# Patient Record
Sex: Female | Born: 1950 | Race: Black or African American | Hispanic: No | Smoking: Never smoker
Health system: Southern US, Community
[De-identification: ages and names within clinical notes are randomized; demographics above are authoritative.]

## PROBLEM LIST (undated history)

## (undated) DIAGNOSIS — E039 Hypothyroidism, unspecified: Secondary | ICD-10-CM

## (undated) DIAGNOSIS — M1990 Unspecified osteoarthritis, unspecified site: Secondary | ICD-10-CM

## (undated) DIAGNOSIS — E119 Type 2 diabetes mellitus without complications: Secondary | ICD-10-CM

## (undated) DIAGNOSIS — J189 Pneumonia, unspecified organism: Secondary | ICD-10-CM

## (undated) DIAGNOSIS — J449 Chronic obstructive pulmonary disease, unspecified: Secondary | ICD-10-CM

## (undated) DIAGNOSIS — M109 Gout, unspecified: Secondary | ICD-10-CM

## (undated) DIAGNOSIS — I519 Heart disease, unspecified: Secondary | ICD-10-CM

## (undated) DIAGNOSIS — N83209 Unspecified ovarian cyst, unspecified side: Secondary | ICD-10-CM

## (undated) DIAGNOSIS — D329 Benign neoplasm of meninges, unspecified: Secondary | ICD-10-CM

## (undated) DIAGNOSIS — K589 Irritable bowel syndrome without diarrhea: Secondary | ICD-10-CM

## (undated) DIAGNOSIS — I351 Nonrheumatic aortic (valve) insufficiency: Secondary | ICD-10-CM

## (undated) DIAGNOSIS — E785 Hyperlipidemia, unspecified: Secondary | ICD-10-CM

## (undated) DIAGNOSIS — E1142 Type 2 diabetes mellitus with diabetic polyneuropathy: Secondary | ICD-10-CM

## (undated) DIAGNOSIS — R001 Bradycardia, unspecified: Secondary | ICD-10-CM

## (undated) DIAGNOSIS — I10 Essential (primary) hypertension: Secondary | ICD-10-CM

## (undated) DIAGNOSIS — J45909 Unspecified asthma, uncomplicated: Secondary | ICD-10-CM

## (undated) DIAGNOSIS — K5909 Other constipation: Secondary | ICD-10-CM

## (undated) DIAGNOSIS — K649 Unspecified hemorrhoids: Secondary | ICD-10-CM

## (undated) HISTORY — PX: HAND SURGERY: SHX662

## (undated) HISTORY — DX: Essential (primary) hypertension: I10

## (undated) HISTORY — PX: TONSILLECTOMY AND ADENOIDECTOMY: SHX28

## (undated) HISTORY — DX: Chronic obstructive pulmonary disease, unspecified: J44.9

## (undated) HISTORY — PX: HERNIA REPAIR: SHX51

## (undated) HISTORY — PX: TONSILLECTOMY: SUR1361

## (undated) HISTORY — DX: Other constipation: K59.09

## (undated) HISTORY — DX: Unspecified osteoarthritis, unspecified site: M19.90

## (undated) HISTORY — PX: ROTATOR CUFF REPAIR: SHX139

## (undated) HISTORY — DX: Benign neoplasm of meninges, unspecified: D32.9

---
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
## 1981-05-10 HISTORY — PX: CHOLECYSTECTOMY: SHX55

## 1987-05-11 HISTORY — PX: OOPHORECTOMY: SHX86

## 1999-01-10 ENCOUNTER — Inpatient Hospital Stay (HOSPITAL_COMMUNITY): Admission: AD | Admit: 1999-01-10 | Discharge: 1999-01-23 | Payer: Self-pay | Admitting: *Deleted

## 1999-01-13 ENCOUNTER — Encounter: Payer: Self-pay | Admitting: *Deleted

## 1999-01-15 ENCOUNTER — Encounter: Payer: Self-pay | Admitting: Internal Medicine

## 1999-01-21 ENCOUNTER — Encounter: Payer: Self-pay | Admitting: Internal Medicine

## 2007-02-20 ENCOUNTER — Inpatient Hospital Stay (HOSPITAL_COMMUNITY): Admission: RE | Admit: 2007-02-20 | Discharge: 2007-02-27 | Payer: Self-pay | Admitting: Psychiatry

## 2007-02-20 ENCOUNTER — Ambulatory Visit: Payer: Self-pay | Admitting: Psychiatry

## 2010-05-10 HISTORY — PX: KNEE SURGERY: SHX244

## 2010-06-24 HISTORY — PX: ESOPHAGOGASTRODUODENOSCOPY: SHX1529

## 2010-09-25 NOTE — Discharge Summary (Signed)
NAMERUE, VALLADARES NO.:  [id number]   MEDICAL RECORD NO.:  [id number]          PATIENT TYPE:  IPS   LOCATION:  0508                          FACILITY:  [facility]   PHYSICIAN:  Anselm Jungling, MD  DATE OF BIRTH:  03/30/1951   DATE OF ADMISSION:  02/20/2007  DATE OF DISCHARGE:  02/27/2007                               DISCHARGE SUMMARY   IDENTIFYING DATA/REASON FOR ADMISSION:  This was an inpatient  psychiatric admission for Michelle Harding, a 60 year old divorced African-American  female who came to [place] with a long history of depression.  She was  admitted because of increasing suicidal ideation.  She described  multiple disabilities, including physical disabilities and no current  treatment for her mood disorder.  Please refer to the admission note for  further details pertaining to the symptoms, circumstances and history  that led to her hospitalization.   INITIAL DIAGNOSTIC IMPRESSION:  She was given an initial AXIS I  diagnosis of depressive disorder not otherwise specified.   MEDICAL/LABORATORY:  The patient was medically and physically assessed  by the psychiatric nurse practitioner.  She came to [place] with a history of  medical problems including hypothyroidism, seasonal allergies,  gastroesophageal reflux disease, hypertension, and was continued on a  regimen of Synthroid, Claritin, Pepcid, Singulair, aspirin, Norvasc,  Diovan, Lasix, and Mobic.  There were no acute medical issues during her  inpatient stay.   HOSPITAL COURSE:  The patient was admitted to the adult inpatient  psychiatric service.  She presented as a well-nourished, well-developed  woman who was alert and fully oriented.  She was quite well-organized.  She was a very pleasant individual but quite sad.  There were no signs  or symptoms of psychosis or thought disorder.  She denied any active  suicidal ideation and verbalized a strong desire for help.   The patient indicated that in the past she had  had a good response to  Prozac and Prozac was restarted at a low dose of 20 mg daily, and once  well-tolerated, was quickly increased to 60 mg daily.  She reported that  she had previously taken up to 80 mg daily of Prozac with good tolerance  and good response.   She participated in various therapeutic groups and activities and was a  good participant in the treatment program.  She indicated that family  matters were a primary source of her stress.  She was able to focus well  on these issues, and we arranged for there to be a family meeting, which  occurred on the fifth hospital day.  In that meeting, the patient's  daughter, two sons and the family therapist met, and discussed concerns  around discharge planning and stress reduction.  The patient spoke about  being overwhelmed with her efforts to help others in the family.  One  stressor includes an 80 year old son who has been diagnosed with bipolar  disorder, has anger problems, and has been hospitalized many times.  The  patient's family reported that the patient had only been getting 3-5  hours of sleep per night because she  is still busy with all these  helping activities.  The patient spoke about needing to get back to her  usual place of worship, feeling that if she was spiritually strong that  she would be well.  The patient accepted the idea of a referral to an  individual therapist and also discussed the possibility of going to a  bipolar support group for herself and her son.  The  bipolar  information sheet was given to the patient.  The patient reported that  she was no longer feeling suicidal and agreed to contact supports if  those feelings returned.  The family was given information on suicide  prevention and the crisis hotline.   The patient continued her stay for an additional two days, and remained  absent any suicidal ideation.  On the eighth hospital day, the patient  indicated that she felt ready for  discharge.  She agreed to the  following aftercare plan.   AFTERCARE:  The patient was to follow up with [facility]  with a walk-in appointment the morning following discharge.  She was  also instructed to follow up with [facility] in [place]  regarding her medical conditions.   DISCHARGE MEDICATIONS:  1. Mobic 15 mg daily.  2. Synthroid 25 mcg daily.  3. Claritin 10 mg daily.  4. Pepcid 20 mg daily.  5. Singulair 10 mg daily.  6. Aspirin 325 mg daily.  7. Norvasc 5 mg daily.  8. Diovan 320 mg daily.  9. Lasix 40 mg daily.  10.Prozac 60 mg daily.   DISCHARGE DIAGNOSES:  AXIS I:  Major depressive disorder, recurrent.  AXIS II:  Deferred.  AXIS III:  History of hypothyroidism, gastroesophageal reflux disease,  seasonal allergies, hypertension.  AXIS IV:  Stressors:  Severe.  AXIS V:  GAF upon discharge 60.      Anselm Jungling, MD  Electronically Signed     SPB/MEDQ  D:  03/06/2007  T:  03/07/2007  Job:  [id number]

## 2011-02-17 LAB — BASIC METABOLIC PANEL
CO2: 29
GFR calc non Af Amer: 51 — ABNORMAL LOW
Glucose, Bld: 130 — ABNORMAL HIGH
Potassium: 3.1 — ABNORMAL LOW
Sodium: 135

## 2011-02-17 LAB — URINALYSIS, ROUTINE W REFLEX MICROSCOPIC
Bilirubin Urine: NEGATIVE
Glucose, UA: NEGATIVE
Hgb urine dipstick: NEGATIVE
Ketones, ur: NEGATIVE
Nitrite: NEGATIVE
Protein, ur: NEGATIVE
Specific Gravity, Urine: 1.009
Urobilinogen, UA: 0.2
pH: 5

## 2011-02-17 LAB — DRUGS OF ABUSE SCREEN W/O ALC, ROUTINE URINE
Amphetamine Screen, Ur: NEGATIVE
Barbiturate Quant, Ur: NEGATIVE
Benzodiazepines.: NEGATIVE
Cocaine Metabolites: NEGATIVE
Creatinine,U: 60
Marijuana Metabolite: NEGATIVE
Methadone: NEGATIVE
Opiate Screen, Urine: NEGATIVE
Phencyclidine (PCP): NEGATIVE
Propoxyphene: NEGATIVE

## 2011-02-17 LAB — URINE MICROSCOPIC-ADD ON

## 2011-02-18 LAB — CBC
Hemoglobin: 12.3
MCHC: 34
RBC: 4
WBC: 6.3

## 2011-02-18 LAB — COMPREHENSIVE METABOLIC PANEL
ALT: 21
Alkaline Phosphatase: 50
CO2: 28
Calcium: 10.1
Chloride: 104
GFR calc non Af Amer: 44 — ABNORMAL LOW
Glucose, Bld: 137 — ABNORMAL HIGH
Sodium: 141
Total Bilirubin: 0.7

## 2011-02-18 LAB — TSH: TSH: 3.611

## 2012-02-07 ENCOUNTER — Inpatient Hospital Stay (HOSPITAL_COMMUNITY)
Admission: EM | Admit: 2012-02-07 | Discharge: 2012-02-10 | DRG: 195 | Disposition: A | Discharge status: Home / Self Care | Payer: Medicare Other | Attending: Family Medicine | Admitting: Family Medicine

## 2012-02-07 ENCOUNTER — Encounter (HOSPITAL_COMMUNITY): Payer: Self-pay | Admitting: Emergency Medicine

## 2012-02-07 ENCOUNTER — Emergency Department (HOSPITAL_COMMUNITY): Payer: Medicare Other

## 2012-02-07 DIAGNOSIS — E119 Type 2 diabetes mellitus without complications: Secondary | ICD-10-CM

## 2012-02-07 DIAGNOSIS — M7989 Other specified soft tissue disorders: Secondary | ICD-10-CM

## 2012-02-07 DIAGNOSIS — IMO0001 Reserved for inherently not codable concepts without codable children: Secondary | ICD-10-CM | POA: Diagnosis present

## 2012-02-07 DIAGNOSIS — K649 Unspecified hemorrhoids: Secondary | ICD-10-CM | POA: Diagnosis present

## 2012-02-07 DIAGNOSIS — R109 Unspecified abdominal pain: Secondary | ICD-10-CM

## 2012-02-07 DIAGNOSIS — M51379 Other intervertebral disc degeneration, lumbosacral region without mention of lumbar back pain or lower extremity pain: Secondary | ICD-10-CM | POA: Diagnosis present

## 2012-02-07 DIAGNOSIS — Z794 Long term (current) use of insulin: Secondary | ICD-10-CM

## 2012-02-07 DIAGNOSIS — M5137 Other intervertebral disc degeneration, lumbosacral region: Secondary | ICD-10-CM | POA: Diagnosis present

## 2012-02-07 DIAGNOSIS — Z7982 Long term (current) use of aspirin: Secondary | ICD-10-CM

## 2012-02-07 DIAGNOSIS — J189 Pneumonia, unspecified organism: Principal | ICD-10-CM

## 2012-02-07 DIAGNOSIS — I10 Essential (primary) hypertension: Secondary | ICD-10-CM

## 2012-02-07 DIAGNOSIS — I498 Other specified cardiac arrhythmias: Secondary | ICD-10-CM | POA: Diagnosis present

## 2012-02-07 DIAGNOSIS — R001 Bradycardia, unspecified: Secondary | ICD-10-CM

## 2012-02-07 DIAGNOSIS — M109 Gout, unspecified: Secondary | ICD-10-CM | POA: Diagnosis present

## 2012-02-07 HISTORY — DX: Heart disease, unspecified: I51.9

## 2012-02-07 HISTORY — DX: Irritable bowel syndrome, unspecified: K58.9

## 2012-02-07 HISTORY — DX: Pneumonia, unspecified organism: J18.9

## 2012-02-07 HISTORY — DX: Unspecified asthma, uncomplicated: J45.909

## 2012-02-07 HISTORY — DX: Essential (primary) hypertension: I10

## 2012-02-07 HISTORY — DX: Type 2 diabetes mellitus without complications: E11.9

## 2012-02-07 HISTORY — DX: Unspecified hemorrhoids: K64.9

## 2012-02-07 HISTORY — DX: Unspecified ovarian cyst, unspecified side: N83.209

## 2012-02-07 HISTORY — DX: Gout, unspecified: M10.9

## 2012-02-07 LAB — URINALYSIS, ROUTINE W REFLEX MICROSCOPIC
Bilirubin Urine: NEGATIVE
Nitrite: NEGATIVE
Specific Gravity, Urine: 1.027 (ref 1.005–1.030)
Urobilinogen, UA: 1 mg/dL (ref 0.0–1.0)

## 2012-02-07 LAB — COMPREHENSIVE METABOLIC PANEL
ALT: 11 U/L (ref 0–35)
Calcium: 9.8 mg/dL (ref 8.4–10.5)
Chloride: 94 mEq/L — ABNORMAL LOW (ref 96–112)
Creatinine, Ser: 0.88 mg/dL (ref 0.50–1.10)
GFR calc Af Amer: 81 mL/min — ABNORMAL LOW (ref >90)
GFR calc non Af Amer: 69 mL/min — ABNORMAL LOW (ref >90)
Potassium: 3.3 mEq/L — ABNORMAL LOW (ref 3.5–5.1)
Sodium: 136 mEq/L (ref 135–145)

## 2012-02-07 LAB — CBC WITH DIFFERENTIAL/PLATELET
Basophils Absolute: 0 10*3/uL (ref 0.0–0.1)
Basophils Relative: 0 % (ref 0–1)
Eosinophils Absolute: 0 10*3/uL (ref 0.0–0.7)
Hemoglobin: 11.4 g/dL — ABNORMAL LOW (ref 12.0–15.0)
MCH: 30.2 pg (ref 26.0–34.0)
MCHC: 33.9 g/dL (ref 30.0–36.0)
Neutro Abs: 8.7 10*3/uL — ABNORMAL HIGH (ref 1.7–7.7)
Neutrophils Relative %: 75 % (ref 43–77)
Platelets: 312 10*3/uL (ref 150–400)
RDW: 15.1 % (ref 11.5–15.5)

## 2012-02-07 LAB — CBC
HCT: 32.1 % — ABNORMAL LOW (ref 36.0–46.0)
Hemoglobin: 10.6 g/dL — ABNORMAL LOW (ref 12.0–15.0)
MCH: 29.6 pg (ref 26.0–34.0)
MCHC: 33 g/dL (ref 30.0–36.0)

## 2012-02-07 LAB — GLUCOSE, CAPILLARY
Glucose-Capillary: 220 mg/dL — ABNORMAL HIGH (ref 70–99)
Glucose-Capillary: 282 mg/dL — ABNORMAL HIGH (ref 70–99)

## 2012-02-07 LAB — URIC ACID: Uric Acid, Serum: 6.8 mg/dL (ref 2.4–7.0)

## 2012-02-07 LAB — CREATININE, SERUM: GFR calc non Af Amer: 76 mL/min — ABNORMAL LOW (ref >90)

## 2012-02-07 LAB — HEMOGLOBIN A1C: Mean Plasma Glucose: 177 mg/dL — ABNORMAL HIGH (ref <117)

## 2012-02-07 LAB — LACTIC ACID, PLASMA: Lactic Acid, Venous: 3 mmol/L — ABNORMAL HIGH (ref 0.5–2.2)

## 2012-02-07 MED ORDER — CLONIDINE HCL 0.1 MG PO TABS
0.1000 mg | ORAL_TABLET | Freq: Every day | ORAL | Status: DC
  Administered 2012-02-07: 0.1 mg via ORAL
  Filled 2012-02-07: qty 1

## 2012-02-07 MED ORDER — ONDANSETRON HCL 4 MG/2ML IJ SOLN
4.0000 mg | Freq: Once | INTRAMUSCULAR | Status: AC
  Administered 2012-02-07: 4 mg via INTRAVENOUS
  Filled 2012-02-07: qty 2

## 2012-02-07 MED ORDER — HYDROMORPHONE HCL PF 1 MG/ML IJ SOLN: 0.5000 mg | INTRAMUSCULAR | Status: DC | PRN

## 2012-02-07 MED ORDER — DEXTROSE 5 % IV SOLN
500.0000 mg | Freq: Once | INTRAVENOUS | Status: AC
  Administered 2012-02-07: 500 mg via INTRAVENOUS
  Filled 2012-02-07: qty 500

## 2012-02-07 MED ORDER — LEVOTHYROXINE SODIUM 25 MCG PO TABS
25.0000 ug | ORAL_TABLET | Freq: Every day | ORAL | Status: DC
  Administered 2012-02-07 – 2012-02-10 (×4): 25 ug via ORAL
  Filled 2012-02-07 (×5): qty 1

## 2012-02-07 MED ORDER — FAMOTIDINE 20 MG PO TABS
20.0000 mg | ORAL_TABLET | Freq: Every day | ORAL | Status: DC
  Administered 2012-02-07 – 2012-02-10 (×4): 20 mg via ORAL
  Filled 2012-02-07 (×4): qty 1

## 2012-02-07 MED ORDER — ACETAMINOPHEN 325 MG PO TABS
650.0000 mg | ORAL_TABLET | Freq: Once | ORAL | Status: AC
  Administered 2012-02-07: 650 mg via ORAL

## 2012-02-07 MED ORDER — HYDRALAZINE HCL 20 MG/ML IJ SOLN
10.0000 mg | Freq: Four times a day (QID) | INTRAMUSCULAR | Status: DC | PRN
  Filled 2012-02-07: qty 0.5

## 2012-02-07 MED ORDER — IOHEXOL 300 MG/ML  SOLN
100.0000 mL | Freq: Once | INTRAMUSCULAR | Status: AC | PRN
  Administered 2012-02-07: 100 mL via INTRAVENOUS

## 2012-02-07 MED ORDER — AMLODIPINE BESYLATE 5 MG PO TABS
5.0000 mg | ORAL_TABLET | Freq: Every day | ORAL | Status: DC
  Administered 2012-02-07: 5 mg via ORAL
  Filled 2012-02-07: qty 1

## 2012-02-07 MED ORDER — SODIUM CHLORIDE 0.9 % IV SOLN
Freq: Once | INTRAVENOUS | Status: AC
  Administered 2012-02-07: 20 mL/h via INTRAVENOUS

## 2012-02-07 MED ORDER — PRAMOXINE-ZINC OXIDE IN MO 1-12.5 % RE OINT
TOPICAL_OINTMENT | Freq: Three times a day (TID) | RECTAL | Status: DC | PRN
  Filled 2012-02-07: qty 28.3

## 2012-02-07 MED ORDER — SIMETHICONE 80 MG PO CHEW
80.0000 mg | CHEWABLE_TABLET | Freq: Four times a day (QID) | ORAL | Status: DC | PRN
  Filled 2012-02-07: qty 1

## 2012-02-07 MED ORDER — SODIUM CHLORIDE 0.9 % IV BOLUS (SEPSIS)
500.0000 mL | Freq: Once | INTRAVENOUS | Status: AC
  Administered 2012-02-07: 500 mL via INTRAVENOUS

## 2012-02-07 MED ORDER — MORPHINE SULFATE 4 MG/ML IJ SOLN
4.0000 mg | Freq: Once | INTRAMUSCULAR | Status: AC
  Administered 2012-02-07: 4 mg via INTRAVENOUS
  Filled 2012-02-07: qty 1

## 2012-02-07 MED ORDER — HYDROCORTISONE 2.5 % RE CREA
TOPICAL_CREAM | Freq: Three times a day (TID) | RECTAL | Status: DC
  Administered 2012-02-07 – 2012-02-10 (×5): via RECTAL
  Filled 2012-02-07: qty 28.35

## 2012-02-07 MED ORDER — PANTOPRAZOLE SODIUM 40 MG PO TBEC
40.0000 mg | DELAYED_RELEASE_TABLET | Freq: Every day | ORAL | Status: DC
  Administered 2012-02-08: 40 mg via ORAL
  Filled 2012-02-07: qty 1

## 2012-02-07 MED ORDER — COLCHICINE 0.6 MG PO TABS
1.2000 mg | ORAL_TABLET | Freq: Once | ORAL | Status: DC
  Filled 2012-02-07: qty 2

## 2012-02-07 MED ORDER — AZITHROMYCIN 500 MG PO TABS
500.0000 mg | ORAL_TABLET | ORAL | Status: DC
  Administered 2012-02-08 – 2012-02-09 (×2): 500 mg via ORAL
  Filled 2012-02-07 (×3): qty 1

## 2012-02-07 MED ORDER — ACETAMINOPHEN 325 MG PO TABS
ORAL_TABLET | ORAL | Status: AC
  Filled 2012-02-07: qty 2

## 2012-02-07 MED ORDER — IOHEXOL 300 MG/ML  SOLN
20.0000 mL | INTRAMUSCULAR | Status: AC
  Administered 2012-02-07: 20 mL via ORAL

## 2012-02-07 MED ORDER — INSULIN DETEMIR 100 UNIT/ML ~~LOC~~ SOLN
10.0000 [IU] | Freq: Every day | SUBCUTANEOUS | Status: DC
  Administered 2012-02-07 – 2012-02-08 (×2): 10 [IU] via SUBCUTANEOUS
  Filled 2012-02-07: qty 10

## 2012-02-07 MED ORDER — ALBUTEROL SULFATE HFA 108 (90 BASE) MCG/ACT IN AERS: 2.0000 | INHALATION_SPRAY | Freq: Four times a day (QID) | RESPIRATORY_TRACT | Status: DC | PRN

## 2012-02-07 MED ORDER — INSULIN ASPART 100 UNIT/ML ~~LOC~~ SOLN
0.0000 [IU] | Freq: Three times a day (TID) | SUBCUTANEOUS | Status: DC
  Administered 2012-02-07 (×2): 5 [IU] via SUBCUTANEOUS
  Administered 2012-02-07: 3 [IU] via SUBCUTANEOUS
  Administered 2012-02-08: 15 [IU] via SUBCUTANEOUS
  Administered 2012-02-08 (×2): 11 [IU] via SUBCUTANEOUS
  Administered 2012-02-09 (×2): 8 [IU] via SUBCUTANEOUS
  Administered 2012-02-09: 11 [IU] via SUBCUTANEOUS
  Administered 2012-02-10 (×2): 3 [IU] via SUBCUTANEOUS

## 2012-02-07 MED ORDER — TRAMADOL HCL 50 MG PO TABS
25.0000 mg | ORAL_TABLET | Freq: Four times a day (QID) | ORAL | Status: DC | PRN
  Administered 2012-02-07: 25 mg via ORAL
  Filled 2012-02-07: qty 1

## 2012-02-07 MED ORDER — ACETAMINOPHEN 325 MG PO TABS
650.0000 mg | ORAL_TABLET | Freq: Four times a day (QID) | ORAL | Status: DC | PRN
  Filled 2012-02-07: qty 2

## 2012-02-07 MED ORDER — SODIUM CHLORIDE 0.9 % IV BOLUS (SEPSIS)
1000.0000 mL | Freq: Once | INTRAVENOUS | Status: AC
  Administered 2012-02-07: 1000 mL via INTRAVENOUS

## 2012-02-07 MED ORDER — COLCHICINE 0.6 MG PO TABS
0.6000 mg | ORAL_TABLET | Freq: Once | ORAL | Status: AC
  Administered 2012-02-07: 0.6 mg via ORAL
  Filled 2012-02-07: qty 1

## 2012-02-07 MED ORDER — AMLODIPINE BESYLATE 10 MG PO TABS
10.0000 mg | ORAL_TABLET | Freq: Every day | ORAL | Status: DC
  Administered 2012-02-08 – 2012-02-10 (×3): 10 mg via ORAL
  Filled 2012-02-07 (×3): qty 1

## 2012-02-07 MED ORDER — METHYLPREDNISOLONE SODIUM SUCC 125 MG IJ SOLR
60.0000 mg | Freq: Four times a day (QID) | INTRAMUSCULAR | Status: AC
  Administered 2012-02-07 – 2012-02-08 (×6): 60 mg via INTRAVENOUS
  Filled 2012-02-07 (×8): qty 0.96

## 2012-02-07 MED ORDER — GUAIFENESIN ER 600 MG PO TB12
600.0000 mg | ORAL_TABLET | Freq: Two times a day (BID) | ORAL | Status: DC
  Administered 2012-02-07 – 2012-02-10 (×7): 600 mg via ORAL
  Filled 2012-02-07 (×10): qty 1

## 2012-02-07 MED ORDER — DEXTROSE 5 % IV SOLN
1.0000 g | INTRAVENOUS | Status: DC
  Administered 2012-02-08 – 2012-02-09 (×2): 1 g via INTRAVENOUS
  Filled 2012-02-07 (×2): qty 10

## 2012-02-07 MED ORDER — INSULIN ASPART 100 UNIT/ML ~~LOC~~ SOLN
0.0000 [IU] | Freq: Every day | SUBCUTANEOUS | Status: DC
  Administered 2012-02-07: 3 [IU] via SUBCUTANEOUS
  Administered 2012-02-08: 5 [IU] via SUBCUTANEOUS

## 2012-02-07 MED ORDER — POTASSIUM CHLORIDE IN NACL 20-0.9 MEQ/L-% IV SOLN
INTRAVENOUS | Status: DC
  Administered 2012-02-07 (×2): via INTRAVENOUS
  Filled 2012-02-07 (×2): qty 1000

## 2012-02-07 MED ORDER — ENOXAPARIN SODIUM 40 MG/0.4ML ~~LOC~~ SOLN
40.0000 mg | SUBCUTANEOUS | Status: DC
  Administered 2012-02-08: 40 mg via SUBCUTANEOUS
  Filled 2012-02-07 (×3): qty 0.4

## 2012-02-07 MED ORDER — CLONIDINE HCL 0.1 MG PO TABS
0.1000 mg | ORAL_TABLET | Freq: Three times a day (TID) | ORAL | Status: DC
  Administered 2012-02-07 – 2012-02-09 (×7): 0.1 mg via ORAL
  Filled 2012-02-07 (×11): qty 1

## 2012-02-07 MED ORDER — DEXTROSE 5 % IV SOLN
1.0000 g | Freq: Once | INTRAVENOUS | Status: AC
  Administered 2012-02-07: 1 g via INTRAVENOUS
  Filled 2012-02-07: qty 10

## 2012-02-07 NOTE — Progress Notes (Signed)
Inpatient Diabetes Program Recommendations  AACE/ADA: New Consensus Statement on Inpatient Glycemic Control (2013)  Target Ranges:  Prepandial:   less than 140 mg/dL      Peak postprandial:   less than 180 mg/dL (1-2 hours)      Critically ill patients:  140 - 180 mg/dL   Reason for Visit: Hyperglycemia  Results for FATE, GALANTI (MRN [id number]) as of 02/07/2012 11:26  Ref. Range 02/07/2012 07:47  Glucose-Capillary Latest Range: 70-99 mg/dL 119 (H)  Results for SIMONNE, BOULOS (MRN [id number]) as of 02/07/2012 11:26  Ref. Range 02/07/2012 00:13  Glucose Latest Range: 70-99 mg/dL 130 (H)    Inpatient Diabetes Program Recommendations Insulin - Meal Coverage: add Novolog 3 units tidwc for meal coverage insulin if pt eats >50% meal HgbA1C: Check HgbA1C to assess glycemic control prior to hospitalization  Will continue to follow.

## 2012-02-07 NOTE — ED Provider Notes (Signed)
History     CSN: [id number]  Arrival date & time 02/07/12  0004   First MD Initiated Contact with Patient 02/07/12 0133      Chief Complaint  Patient presents with  . Abdominal Pain     Patient is a 61 y.o. female presenting with abdominal pain. The history is provided by the patient.  Abdominal Pain The primary symptoms of the illness include abdominal pain, fever, nausea and diarrhea. The primary symptoms of the illness do not include vomiting. The current episode started 2 days ago. The onset of the illness was gradual. The problem has been gradually worsening.  Additional symptoms associated with the illness include chills.  pt reports abdominal pain for at least 2 days She reports body aches and "joint swelling" She also reports "my hemorrhoids are enflamed" and causing bleeding She denies cough.   She reports h/o cholecystectomy and "hernia surgery" performed at outside hospitals  Past Medical History  Diagnosis Date  . Hemorrhoids   . Asthma   . Hypertension   . Diabetes mellitus   . Ovarian cyst     Past Surgical History  Procedure Date  . Tonsillectomy   . Rotator cuff repair   . Tonsillectomy and adenoidectomy   . Hand surgery     No family history on file.  History  Substance Use Topics  . Smoking status: Current Every Day Smoker  . Smokeless tobacco: Not on file  . Alcohol Use: No    OB History    Grav Para Term Preterm Abortions TAB SAB Ect Mult Living                  Review of Systems  Constitutional: Positive for fever and chills.  Gastrointestinal: Positive for nausea, abdominal pain and diarrhea. Negative for vomiting.  All other systems reviewed and are negative.    Allergies  Review of patient's allergies indicates not on file.  Home Medications   Current Outpatient Rx  Name Route Sig Dispense Refill  . ALBUTEROL SULFATE HFA 108 (90 BASE) MCG/ACT IN AERS Inhalation Inhale 2 puffs into the lungs every 6 (six) hours as needed.  For shortness of breath    . AMLODIPINE BESYLATE 5 MG PO TABS Oral Take 5 mg by mouth daily.    . ASPIRIN 325 MG PO TABS Oral Take 325 mg by mouth every 6 (six) hours as needed. For pain    . CLONIDINE HCL 0.1 MG PO TABS Oral Take 0.1 mg by mouth daily.    . COLCHICINE 0.6 MG PO TABS Oral Take 0.6 mg by mouth daily as needed. For gout    . INSULIN DETEMIR 100 UNIT/ML [place] SOLN Subcutaneous Inject 0.5 Units into the skin daily.    . INSULIN LISPRO PROT & LISPRO (50-50) 100 UNIT/ML  SUSP Subcutaneous Inject 5-7 Units into the skin 3 (three) times daily before meals.    Marland Kitchen LEVOTHYROXINE SODIUM 25 MCG PO TABS Oral Take 25 mcg by mouth daily.    . COLON CLEANSER PO Oral Take 1 tablet by mouth daily as needed. For constipation    . OMEPRAZOLE 40 MG PO CPDR Oral Take 40 mg by mouth daily.    Marland Kitchen RANITIDINE HCL 150 MG PO TABS Oral Take 150 mg by mouth daily.      BP 164/105  Pulse 93  Temp 102.2 F (39 C) (Oral)  Resp 16  SpO2 97% BP 177/70  Pulse 84  Temp 99.8 F (37.7 C) (Oral)  Resp  18  SpO2 94%   Physical Exam CONSTITUTIONAL: Well developed/well nourished HEAD AND FACE: Normocephalic/atraumatic EYES: EOMI/PERRL ENMT: Mucous membranes moist NECK: supple no meningeal signs SPINE:entire spine nontender CV: S1/S2 noted, no murmurs/rubs/gallops noted LUNGS: Lungs are clear to auscultation bilaterally, no apparent distress ABDOMEN: soft, diffuse tenderness, tenderness is moderate, no rebound or guarding GU:no cva tenderness Rectal - stool color normal.  No external hemorrhoids noted.  Tenderness noted but no abscess noted, chaperone present NEURO: Pt is awake/alert, moves all extremitiesx4 EXTREMITIES: pulses normal, full ROM SKIN: warm, color normal PSYCH: no abnormalities of mood noted  ED Course  Procedures   Labs Reviewed  CBC WITH DIFFERENTIAL - Abnormal; Notable for the following:    WBC 11.6 (*)     RBC 3.78 (*)     Hemoglobin 11.4 (*)     HCT 33.6 (*)     Neutro Abs  8.7 (*)     All other components within normal limits  COMPREHENSIVE METABOLIC PANEL - Abnormal; Notable for the following:    Potassium 3.3 (*)     Chloride 94 (*)     Glucose, Bld 251 (*)     GFR calc non Af Amer 69 (*)     GFR calc Af Amer 81 (*)     All other components within normal limits  URINALYSIS, ROUTINE W REFLEX MICROSCOPIC - Abnormal; Notable for the following:    Glucose, UA 100 (*)     Ketones, ur 15 (*)     All other components within normal limits  LIPASE, BLOOD  LACTIC ACID, PLASMA   Dg Chest Port 1 View  02/07/2012  *RADIOLOGY REPORT*  Clinical Data: Chest and abdominal pain.  PORTABLE CHEST - 1 VIEW  Comparison: 12/24/2011  Findings: Mild bibasilar opacities. Mildly prominent cardiomediastinal contours, similar to prior. May be accentuated by portable technique and hypoaeration.  No definite pleural effusion. No pneumothorax.  No acute osseous finding.  IMPRESSION: Mild bibasilar opacities; atelectasis versus infiltrate.   Original Report Authenticated By: Waneta Martins, M.D.    2:20 AM Pt with fever, abdominal pain, will require CT imaging She is currently stable at this time 5:03 AM No acute abdominal process However does have pneumonia, likely source of fever This is community acquired pna Her abdomen is tender, though no guarding, does not appear to have surgical abdomen at this time D/w medicine to admit   MDM  Nursing notes including past medical history and social history reviewed and considered in documentation xrays reviewed and considered Labs/vital reviewed and considered         Joya Gaskins, MD 02/07/12 [phone]

## 2012-02-07 NOTE — Progress Notes (Signed)
[facility] PROGRESS NOTE  Michelle Harding ZOX:[id number] DOB: 12-13-1950 DOA: 02/07/2012 PCP: Sheila Oats, MD  Assessment/Plan: Principal Problem:  *Community acquired pneumonia Active Problems:  Hypertension  Diabetes mellitus  Gout attack   1. Community acquired pneumonia: Patient presented with a week of productive cough, without pyrexia, and since 02/04/12, has had increasing RUQ abdominal pain Imaging studies, including CXR and abdominal/pelvic CT scan, revealed infiltration in the RML/Lung base, suspicious for community-acquired pneumonia. Now on iv Rocephin/Azithromycin, day#2. She remains apyrexial. Continue current antibiotics and supportive treatment.  2. Hypertension: This is sub-optimally controlled on pre-admission antihypertensives. Have increased Norvasc to 10 mg daily, and Clonidine to 0.1 mg t.i.d. Will adjust as indicated 3. Diabetes mellitus: Insulin-requiring type 2. Uncontrolled this AM, with CBG of 220. Managing with diet, SSI and Levemir.  4. Gout attack: Patient appears to have a polyarticular flare up of gout, affecting bilateral wrists and right foot. Will manage with a course of steroids. Patient has history of Gi bleed, so NSAIDS are contra-indicated.  5. Abdominal pain: This is probably referred pain from pneumonia. Abdomina/pelvic CT scan showed hepatic steatosis and biliary ductal dilatation is similar to prior status post cholecystectomy. No acute findings were in evidence. 6. Hemorrhoids: Per patient, symptomatic last few days. Managing with Anusol cream.    Code Status: Full Code.  Family Communication: Discussed fully with patient.  Disposition Plan: To be determined.    Brief narrative: 61 y.o. female with history of Hemorrhoids, asthma, HTN, diabetes mellitus, ovarian cyst, IBS, lumbar and knee DJD, presenting with abdominal pain since 02/04/12, and has had trouble with hemorrhoids, and has had a cough, productive of phlegm, over the past one  week. CXR demonstrated findings, consistent with pneumonia. Patient has also had swelling in her right hand and right foot typical for her gout attacks. She was admitted for further management.    Consultants:  N/A.   Procedures:  CXR.  Abdominal/pelvic CT scan.   Antibiotics:  Rocephin/Azithromycin.   HPI/Subjective: C/O pains in bilateral wrists and right foot.   Objective: Vital signs in last 24 hours: Temp:  [99.6 F (37.6 C)-102.2 F (39 C)] 99.6 F (37.6 C) (09/30 8119) Pulse Rate:  [83-93] 83  (09/30 0638) Resp:  [16-18] 18  (09/30 1478) BP: (164-217)/(70-105) 217/99 mmHg (09/30 0638) SpO2:  [94 %-98 %] 98 % (09/30 2956) Weight:  [116.4 kg (256 lb 9.9 oz)] 116.4 kg (256 lb 9.9 oz) (09/30 2130) Weight change:     Intake/Output from previous day:       Physical Exam: General: In some discomfort from, alert, communicative, fully oriented, not short of breath at rest.  HEENT:  Mild clinical pallor, no jaundice, no conjunctival injection or discharge. Hydration is fair.  NECK:  Supple, JVP not seen, no carotid bruits, no palpable lymphadenopathy, no palpable goiter. CHEST:  Clinically clear to auscultation, no wheezes, no crackles. HEART:  Sounds 1 and 2 heard, normal, regular, no murmurs. ABDOMEN:  Morbidly obese, soft, moderate RUQ discomfort, no palpable organomegaly, no palpable masses, normal bowel sounds. GENITALIA:  Not examined. LOWER EXTREMITIES:  No pitting edema, palpable peripheral pulses. MUSCULOSKELETAL SYSTEM:  Has swelling, increased local temperature and decreased ROM both wrists and tenderness right foot, with diminished right ankle ROM, due to pain.  CENTRAL NERVOUS SYSTEM:  No focal neurologic deficit on gross examination.  Lab Results:  [facility] 02/07/12 0013  WBC 11.6*  HGB 11.4*  HCT 33.6*  PLT 312    Basename 02/07/12 0013  NA 136  K 3.3*  CL 94*  CO2 29  GLUCOSE 251*  BUN 13  CREATININE 0.88  CALCIUM 9.8   No results  found for this or any previous visit (from the past 240 hour(s)).   Studies/Results: Ct Abdomen Pelvis W Contrast  02/07/2012  *RADIOLOGY REPORT*  Clinical Data: Abdominal pain  CT ABDOMEN AND PELVIS WITH CONTRAST  Technique:  Multidetector CT imaging of the abdomen and pelvis was performed following the standard protocol during bolus administration of intravenous contrast.  Contrast: 1 OMNIPAQUE IOHEXOL 300 MG/ML  SOLN, OMNIPAQUE IOHEXOL 300 MG/ML  SOLN  Comparison: 12/24/2011, 11/16/2005.  Findings: Trace right pleural effusion.  Mild bibasilar opacities. Small hiatal hernia.  Heart size upper normal.  Trace pericardial fluid.  Low attenuation of the liver is in keeping with fatty infiltration. The liver contour is lobular, nonspecific however similar to priors dating back to 2007.  There is dilatation of the common bile duct and central intrahepatic ducts.  The CBD measures up to 10 mm in diameter, tapering at the ampulla. Absent gallbladder.  Unremarkable spleen, pancreas, adrenal glands.  Symmetric renal enhancement.  No hydronephrosis or hydroureter.  No bowel obstruction.  No CT evidence for colitis.  Normal appendix.  No free intraperitoneal air or fluid.  No lymphadenopathy.  Normal caliber aorta and branch vessels.  Status post anterior abdominal wall hernia repair.  Thin-walled bladder.  CT appearance of the uterus within normal limits.  Left adnexa is similar to priors with three ovarian cysts measuring up to 2.2 cm.  No pelvic lymphadenopathy.  Multilevel degenerative changes of the imaged spine. No acute or aggressive appearing osseous lesion.  IMPRESSION: Mild right lung base opacity and associated trace pleural effusion may reflect pneumonia.  Hepatic steatosis.  Biliary ductal dilatation is similar to prior status post cholecystectomy.  Recommend LFT correlation and ERCP if warranted.  Nonspecific left ovarian cysts, similar to priors. Consider non emergent pelvic ultrasound follow-up.    Original Report Authenticated By: Waneta Martins, M.D.    Dg Chest Port 1 View  02/07/2012  *RADIOLOGY REPORT*  Clinical Data: Chest and abdominal pain.  PORTABLE CHEST - 1 VIEW  Comparison: 12/24/2011  Findings: Mild bibasilar opacities. Mildly prominent cardiomediastinal contours, similar to prior. May be accentuated by portable technique and hypoaeration.  No definite pleural effusion. No pneumothorax.  No acute osseous finding.  IMPRESSION: Mild bibasilar opacities; atelectasis versus infiltrate.   Original Report Authenticated By: Waneta Martins, M.D.     Medications: Scheduled Meds:   . sodium chloride   Intravenous Once  . acetaminophen  650 mg Oral Once  . amLODipine  5 mg Oral Daily  . azithromycin  500 mg Intravenous Once  . azithromycin  500 mg Oral Q24H  . cefTRIAXone (ROCEPHIN)  IV  1 g Intravenous Once  . cefTRIAXone (ROCEPHIN)  IV  1 g Intravenous Q24H  . cloNIDine  0.1 mg Oral Daily  . colchicine  0.6 mg Oral Once  . colchicine  1.2 mg Oral Once  . enoxaparin  40 mg Subcutaneous Q24H  . famotidine  20 mg Oral Daily  . hydrocortisone   Rectal TID  . insulin aspart  0-15 Units Subcutaneous TID WC  . insulin aspart  0-5 Units Subcutaneous QHS  . iohexol  20 mL Oral Q1 Hr x 2  . levothyroxine  25 mcg Oral Q0600  .  morphine injection  4 mg Intravenous Once  .  morphine injection  4 mg Intravenous Once  .  ondansetron  4 mg Intravenous Once  . pantoprazole  40 mg Oral Q1200  . sodium chloride  1,000 mL Intravenous Once  . sodium chloride  500 mL Intravenous Once   Continuous Infusions:   . 0.9 % NaCl with KCl 20 mEq / L     PRN Meds:.albuterol, iohexol, pramoxine-mineral oil-zinc, simethicone    LOS: 0 days   Maekayla Giorgio,CHRISTOPHER  [facility] Pager [phone]. If 8PM-8AM, please contact night-coverage at [email or web address], password [facility] 02/07/2012, 8:37 AM  LOS: 0 days

## 2012-02-07 NOTE — H&P (Signed)
PCP:  [place] in [place]   Chief Complaint:   Abdominal pain  HPI: Michelle Harding is a 61 y.o. female   has a past medical history of Hemorrhoids; Asthma; Hypertension; Diabetes mellitus; Ovarian cyst; Heart disease; IBS (irritable bowel syndrome); DJD (degenerative joint disease) of lumbar spine; and DJD (degenerative joint disease) of knee.   Presented with  She has had abdominal pain since Friday and have had trouble with hemorrhoids. She was found to have incidental Pneumonia. Patient also have had swelling in her right hand and right foot typical for her gout attacks. She has been coughing up mucus since last week.   Review of Systems:    Pertinent positives include:Fevers, chills, fatigue, abdominal pain, nausea, excess mucus,  Constitutional:  No weight loss, night sweats, weight loss  HEENT:  No headaches, Difficulty swallowing,Tooth/dental problems,Sore throat,  No sneezing, itching, ear ache, nasal congestion, post nasal drip,  Cardio-vascular:  No chest pain, Orthopnea, PND, anasarca, dizziness, palpitations.no Bilateral lower extremity swelling  GI:  No heartburn, indigestion, vomiting, diarrhea, change in bowel habits, loss of appetite, melena, blood in stool, hematemesis Resp:  no shortness of breath at rest. No dyspnea on exertion, No  no productive cough, No non-productive cough, No coughing up of blood.No change in color of mucus.No wheezing. Skin:  no rash or lesions. No jaundice GU:  no dysuria, change in color of urine, no urgency or frequency. No straining to urinate.  No flank pain.  Musculoskeletal:  No joint pain or no joint swelling. No decreased range of motion. No back pain.  Psych:  No change in mood or affect. No depression or anxiety. No memory loss.  Neuro: no localizing neurological complaints, no tingling, no weakness, no double vision, no gait abnormality, no slurred speech, no confusion  Otherwise ROS are negative except for above, 10 systems  were reviewed  Past Medical History: Past Medical History  Diagnosis Date  . Hemorrhoids   . Asthma   . Hypertension   . Diabetes mellitus   . Ovarian cyst   . Heart disease   . IBS (irritable bowel syndrome)   . DJD (degenerative joint disease) of lumbar spine   . DJD (degenerative joint disease) of knee    Past Surgical History  Procedure Date  . Tonsillectomy   . Rotator cuff repair   . Tonsillectomy and adenoidectomy   . Hand surgery   . Hernia repair     With mesh     Medications: Prior to Admission medications   Medication Sig Start Date End Date Taking? Authorizing Provider  albuterol (PROVENTIL HFA;VENTOLIN HFA) 108 (90 BASE) MCG/ACT inhaler Inhale 2 puffs into the lungs every 6 (six) hours as needed. For shortness of breath   Yes Historical Provider, MD  amLODipine (NORVASC) 5 MG tablet Take 5 mg by mouth daily.   Yes Historical Provider, MD  aspirin 325 MG tablet Take 325 mg by mouth every 6 (six) hours as needed. For pain   Yes Historical Provider, MD  cloNIDine (CATAPRES) 0.1 MG tablet Take 0.1 mg by mouth daily.   Yes Historical Provider, MD  colchicine 0.6 MG tablet Take 0.6 mg by mouth daily as needed. For gout   Yes Historical Provider, MD  insulin detemir (LEVEMIR) 100 UNIT/ML injection Inject 0.5 Units into the skin daily.   Yes Historical Provider, MD  insulin lispro protamine-insulin lispro (HUMALOG 50/50) (50-50) 100 UNIT/ML SUSP Inject 5-7 Units into the skin 3 (three) times daily before meals.   Yes  Historical Provider, MD  levothyroxine (SYNTHROID, LEVOTHROID) 25 MCG tablet Take 25 mcg by mouth daily.   Yes Historical Provider, MD  Misc Natural Products (COLON CLEANSER PO) Take 1 tablet by mouth daily as needed. For constipation   Yes Historical Provider, MD  omeprazole (PRILOSEC) 40 MG capsule Take 40 mg by mouth daily.   Yes Historical Provider, MD  ranitidine (ZANTAC) 150 MG tablet Take 150 mg by mouth daily.   Yes Historical Provider, MD     Allergies:   Allergies  Allergen Reactions  . Aspirin     Patient says it hurts her stomach, has to be coaed  . Vicodin (Hydrocodone-Acetaminophen)     Patient says Vicodin makes her itch    Social History:  Ambulatory  independently Lives at  home   reports that she has been smoking.  She does not have any smokeless tobacco history on file. She reports that she uses illicit drugs (Marijuana). She reports that she does not drink alcohol.   Family History: family history includes Diabetes type II in her father and son.    Physical Exam: Patient Vitals for the past 24 hrs:  BP Temp Temp src Pulse Resp SpO2  02/07/12 0310 177/70 mmHg 99.8 F (37.7 C) Oral 84  18  94 %  02/07/12 0011 164/105 mmHg 102.2 F (39 C) Oral 93  16  97 %    1. General:  in No Acute distress 2. Psychological: Alert and  Oriented 3. Head/ENT:   Moist  Mucous Membranes                          Head Non traumatic, neck supple                          Normal  Dentition 4. SKIN: normal Skin turgor,  Skin clean Dry and intact no rash 5. Heart: Regular rate and rhythm no Murmur, Rub or gallop 6. Lungs: occasional crackles   7. Abdomen: Soft, with some slight tenderness, Non distended 8. Lower extremities: no clubbing, cyanosis, right ankle red hot and swollen.  9. Neurologically Grossly intact, moving all 4 extremities equally 10. MSK: Normal range of motion  body mass index is unknown because there is no height or weight on file.   Labs on Admission:   [facility] 02/07/12 0013  NA 136  K 3.3*  CL 94*  CO2 29  GLUCOSE 251*  BUN 13  CREATININE 0.88  CALCIUM 9.8  MG --  PHOS --    Basename 02/07/12 0013  AST 15  ALT 11  ALKPHOS 62  BILITOT 0.6  PROT 7.3  ALBUMIN 3.5    Basename 02/07/12 0143  LIPASE 24  AMYLASE --    Basename 02/07/12 0013  WBC 11.6*  NEUTROABS 8.7*  HGB 11.4*  HCT 33.6*  MCV 88.9  PLT 312   No results found for this basename:  CKTOTAL:3,CKMB:3,CKMBINDEX:3,TROPONINI:3 in the last 72 hours No results found for this basename: TSH,T4TOTAL,FREET3,T3FREE,THYROIDAB in the last 72 hours No results found for this basename: VITAMINB12:2,FOLATE:2,FERRITIN:2,TIBC:2,IRON:2,RETICCTPCT:2 in the last 72 hours No results found for this basename: HGBA1C    CrCl is unknown because there is no height on file for the current visit. ABG No results found for this basename: phart, pco2, po2, hco3, tco2, acidbasedef, o2sat     No results found for this basename: DDIMER     UA no evidence of infection  Cultures: No results found for this basename: sdes, specrequest, cult, reptstatus       Radiological Exams on Admission: Ct Abdomen Pelvis W Contrast  02/07/2012  *RADIOLOGY REPORT*  Clinical Data: Abdominal pain  CT ABDOMEN AND PELVIS WITH CONTRAST  Technique:  Multidetector CT imaging of the abdomen and pelvis was performed following the standard protocol during bolus administration of intravenous contrast.  Contrast: 1 OMNIPAQUE IOHEXOL 300 MG/ML  SOLN, OMNIPAQUE IOHEXOL 300 MG/ML  SOLN  Comparison: 12/24/2011, 11/16/2005.  Findings: Trace right pleural effusion.  Mild bibasilar opacities. Small hiatal hernia.  Heart size upper normal.  Trace pericardial fluid.  Low attenuation of the liver is in keeping with fatty infiltration. The liver contour is lobular, nonspecific however similar to priors dating back to 2007.  There is dilatation of the common bile duct and central intrahepatic ducts.  The CBD measures up to 10 mm in diameter, tapering at the ampulla. Absent gallbladder.  Unremarkable spleen, pancreas, adrenal glands.  Symmetric renal enhancement.  No hydronephrosis or hydroureter.  No bowel obstruction.  No CT evidence for colitis.  Normal appendix.  No free intraperitoneal air or fluid.  No lymphadenopathy.  Normal caliber aorta and branch vessels.  Status post anterior abdominal wall hernia repair.  Thin-walled bladder.  CT  appearance of the uterus within normal limits.  Left adnexa is similar to priors with three ovarian cysts measuring up to 2.2 cm.  No pelvic lymphadenopathy.  Multilevel degenerative changes of the imaged spine. No acute or aggressive appearing osseous lesion.  IMPRESSION: Mild right lung base opacity and associated trace pleural effusion may reflect pneumonia.  Hepatic steatosis.  Biliary ductal dilatation is similar to prior status post cholecystectomy.  Recommend LFT correlation and ERCP if warranted.  Nonspecific left ovarian cysts, similar to priors. Consider non emergent pelvic ultrasound follow-up.   Original Report Authenticated By: Waneta Martins, M.D.    Dg Chest Port 1 View  02/07/2012  *RADIOLOGY REPORT*  Clinical Data: Chest and abdominal pain.  PORTABLE CHEST - 1 VIEW  Comparison: 12/24/2011  Findings: Mild bibasilar opacities. Mildly prominent cardiomediastinal contours, similar to prior. May be accentuated by portable technique and hypoaeration.  No definite pleural effusion. No pneumothorax.  No acute osseous finding.  IMPRESSION: Mild bibasilar opacities; atelectasis versus infiltrate.   Original Report Authenticated By: Waneta Martins, M.D.     Chart has been reviewed  Assessment/Plan  61 yo F presented with abdominal pin and worsening hemorrhoids and was found to have incidental pnumonia  Present on Admission:  .Community acquired pneumonia -  - will admit for treatment of CAP will start on appropriate antibiotic coverage.   Obtain sputum cultures, blood cultures if febrile or if decompensates.  Provide oxygen as needed.   .Hypertension - continue home medicaitons .Diabetes mellitus - ssi , hold levemir .Gout attack - will check uric acid, treat with colchicine, avoid steroid in the setting of active infection. R/O DVT with doppler. Patient has history of Gi bleed would avoid NSAIDS. abdominal pain - likely reffered   Prophylaxis:  Lovenox, Protonix  CODE STATUS:  FULL CODE  Other plan as per orders.  I have spent a total of 55 min on this admission  Ezelle Surprenant 02/07/2012, 5:55 AM

## 2012-02-07 NOTE — ED Notes (Signed)
PT. REPORTS UPPER ABDOMINAL PAIN WITH NAUSEA AND DIARRHEA / BLEEDING HEMORRHOIDS ONSET 3 DAYS AGO.

## 2012-02-07 NOTE — Progress Notes (Signed)
VASCULAR LAB PRELIMINARY  PRELIMINARY  PRELIMINARY  PRELIMINARY  Right lower extremity venous duplex completed.    Preliminary report:  Right:  No evidence of DVT, superficial thrombosis, or Baker's cyst.  Marzelle Rutten, RVT 02/07/2012, 11:15 AM

## 2012-02-08 LAB — CBC
HCT: 30.1 % — ABNORMAL LOW (ref 36.0–46.0)
Hemoglobin: 10.2 g/dL — ABNORMAL LOW (ref 12.0–15.0)
MCV: 88.5 fL (ref 78.0–100.0)
RBC: 3.4 MIL/uL — ABNORMAL LOW (ref 3.87–5.11)
WBC: 16.5 10*3/uL — ABNORMAL HIGH (ref 4.0–10.5)

## 2012-02-08 LAB — COMPREHENSIVE METABOLIC PANEL
BUN: 13 mg/dL (ref 6–23)
CO2: 27 mEq/L (ref 19–32)
Calcium: 9.3 mg/dL (ref 8.4–10.5)
Creatinine, Ser: 0.87 mg/dL (ref 0.50–1.10)
GFR calc Af Amer: 82 mL/min — ABNORMAL LOW (ref >90)
GFR calc non Af Amer: 70 mL/min — ABNORMAL LOW (ref >90)
Glucose, Bld: 383 mg/dL — ABNORMAL HIGH (ref 70–99)
Sodium: 135 mEq/L (ref 135–145)
Total Protein: 7 g/dL (ref 6.0–8.3)

## 2012-02-08 LAB — CBC WITH DIFFERENTIAL/PLATELET
Basophils Relative: 0 % (ref 0–1)
HCT: 30.7 % — ABNORMAL LOW (ref 36.0–46.0)
Hemoglobin: 10.4 g/dL — ABNORMAL LOW (ref 12.0–15.0)
Lymphocytes Relative: 5 % — ABNORMAL LOW (ref 12–46)
Monocytes Absolute: 0.7 10*3/uL (ref 0.1–1.0)
Monocytes Relative: 5 % (ref 3–12)
Neutro Abs: 11.7 10*3/uL — ABNORMAL HIGH (ref 1.7–7.7)
Neutrophils Relative %: 89 % — ABNORMAL HIGH (ref 43–77)
RBC: 3.48 MIL/uL — ABNORMAL LOW (ref 3.87–5.11)
WBC: 13.1 10*3/uL — ABNORMAL HIGH (ref 4.0–10.5)

## 2012-02-08 LAB — GLUCOSE, CAPILLARY: Glucose-Capillary: 404 mg/dL — ABNORMAL HIGH (ref 70–99)

## 2012-02-08 MED ORDER — PREDNISONE 20 MG PO TABS
40.0000 mg | ORAL_TABLET | Freq: Every day | ORAL | Status: DC
  Administered 2012-02-09: 40 mg via ORAL
  Filled 2012-02-08 (×2): qty 2

## 2012-02-08 MED ORDER — PANTOPRAZOLE SODIUM 40 MG PO TBEC
40.0000 mg | DELAYED_RELEASE_TABLET | Freq: Two times a day (BID) | ORAL | Status: DC
  Administered 2012-02-09 – 2012-02-10 (×4): 40 mg via ORAL
  Filled 2012-02-08 (×4): qty 1

## 2012-02-08 MED ORDER — INFLUENZA VIRUS VACC SPLIT PF IM SUSP
0.5000 mL | INTRAMUSCULAR | Status: AC
  Administered 2012-02-09: 0.5 mL via INTRAMUSCULAR
  Filled 2012-02-08: qty 0.5

## 2012-02-08 MED ORDER — INSULIN ASPART 100 UNIT/ML ~~LOC~~ SOLN
4.0000 [IU] | Freq: Three times a day (TID) | SUBCUTANEOUS | Status: DC
  Administered 2012-02-08 – 2012-02-10 (×6): 4 [IU] via SUBCUTANEOUS

## 2012-02-08 MED ORDER — PNEUMOCOCCAL VAC POLYVALENT 25 MCG/0.5ML IJ INJ
0.5000 mL | INJECTION | INTRAMUSCULAR | Status: AC
  Administered 2012-02-09: 0.5 mL via INTRAMUSCULAR
  Filled 2012-02-08: qty 0.5

## 2012-02-08 MED ORDER — INSULIN DETEMIR 100 UNIT/ML ~~LOC~~ SOLN
15.0000 [IU] | Freq: Every day | SUBCUTANEOUS | Status: DC
  Administered 2012-02-09 – 2012-02-10 (×2): 15 [IU] via SUBCUTANEOUS
  Filled 2012-02-08: qty 10

## 2012-02-08 NOTE — Progress Notes (Signed)
MD stated to just give pt scheduled 5 units of insulin for bedtime. RN will continue to monitor pt.

## 2012-02-08 NOTE — Progress Notes (Addendum)
[facility] PROGRESS NOTE  Michelle Harding ZOX:[id number] DOB: February 15, 1951 DOA: 02/07/2012 PCP: Sheila Oats, MD  Assessment/Plan: Principal Problem:  *Community acquired pneumonia Active Problems:  Hypertension  Diabetes mellitus  Gout attack   1. Community acquired pneumonia: Patient presented with a week of productive cough, without pyrexia, and since 02/04/12, has had increasing RUQ abdominal pain. Imaging studies, including CXR and abdominal/pelvic CT scan, revealed infiltration in the RML/Lung base, suspicious for community-acquired pneumonia. Now on iv Rocephin/Azithromycin, day#3. She remains apyrexial and feels considerably better today. Continue current antibiotics and supportive treatment.  2. Hypertension: This was sub-optimally controlled on pre-admission antihypertensives. Have increased Norvasc to 10 mg daily, and Clonidine to 0.1 mg t.i.d, with improved control.  3. Diabetes mellitus: Insulin-requiring type 2. Uncontrolled this AM, with CBG of 220. Managing with diet, SSI and Levemir. CBGs are still elevated today, possibly due to steroid treatment. Will adjust Levemir, further. 4. Gout attack: Patient appears to have a polyarticular flare up of gout, affecting bilateral wrists and right foot. Patient has history of GI bleed, so NSAIDS are contra-indicated. Patient was placed on parenteral steroids on 02/07/12, with dramatic response. Today, wrist swelling and tenderness has improved bilaterally, and ROM is much better. Will transition to oral steroids from 02/09/12. 5. Abdominal pain: This is probably referred pain from pneumonia. Abdominal/pelvic CT scan showed hepatic steatosis and biliary ductal dilatation is similar to prior status post cholecystectomy. No acute findings were in evidence. Today, patient no longer has abdominal pain. 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 She is due to see a  gastroenterologist on 02/24/12. Will change PPI to b.i.d, and have urged her to keep her appointment. 6. Hemorrhoids: Per patient, symptomatic last few days. Managing with Anusol cream.    Code Status: Full Code.  Family Communication: Discussed fully with patient.  Disposition Plan: To be determined.   Note: Patient requested a note for work, for her daughter Tomasa Hose, who had to be absent to be with her on 02/07/12, when she was hospitalized. This was provided accordingly.    Brief narrative: 61 y.o. female with history of Hemorrhoids, asthma, HTN, diabetes mellitus, ovarian cyst, IBS, lumbar and knee DJD, presenting with abdominal pain since 02/04/12, and has had trouble with hemorrhoids, and has had a cough, productive of phlegm, over the past one week. CXR demonstrated findings, consistent with pneumonia. Patient has also had swelling in her right hand and right foot typical for her gout attacks. She was admitted for further management.    Consultants:  N/A.   Procedures:  CXR.  Abdominal/pelvic CT scan.   Antibiotics:  Rocephin/Azithromycin.   HPI/Subjective: Feeling much better.   Objective: Vital signs in last 24 hours: Temp:  [98.4 F (36.9 C)-99.8 F (37.7 C)] 98.4 F (36.9 C) (10/01 0520) Pulse Rate:  [70-84] 70  (10/01 0520) Resp:  [16-20] 16  (10/01 0520) BP: (113-148)/(71-87) 144/87 mmHg (10/01 0520) SpO2:  [94 %-96 %] 95 % (10/01 0520) Weight change:  Last BM Date: 02/07/12  Intake/Output from previous day: 09/30 0701 - 10/01 0700 In: 583 [P.O.:358; I.V.:225] Out: -      Physical Exam: General: Sitting in chair comfortably, eating, alert, communicative, fully oriented, not short of breath at rest.  HEENT:  Mild clinical pallor, no jaundice, no conjunctival injection or discharge. Hydration is fair.  NECK:  Supple, JVP not seen, no carotid bruits, no palpable lymphadenopathy, no palpable goiter. CHEST:  Clinically  clear to auscultation, no  wheezes, no crackles. HEART:  Sounds 1 and 2 heard, normal, regular, no murmurs. ABDOMEN:  Morbidly obese, soft, nontender, no palpable organomegaly, no palpable masses, normal bowel sounds. GENITALIA:  Not examined. LOWER EXTREMITIES:  No pitting edema, palpable peripheral pulses. MUSCULOSKELETAL SYSTEM:  In creased ROM and decreased tenderness and swelling, both wrists and tenderness right foot/right ankle.  CENTRAL NERVOUS SYSTEM:  No focal neurologic deficit on gross examination.  Lab Results:  Basename 02/08/12 0505 02/07/12 0848  WBC 13.1* 12.5*  HGB 10.4* 10.6*  HCT 30.7* 32.1*  PLT 310 300    Basename 02/08/12 0505 02/07/12 0848 02/07/12 0013  NA 135 -- 136  K 3.8 -- 3.3*  CL 95* -- 94*  CO2 27 -- 29  GLUCOSE 383* -- 251*  BUN 13 -- 13  CREATININE 0.87 0.82 --  CALCIUM 9.3 -- 9.8   Recent Results (from the past 240 hour(s))  MRSA PCR SCREENING     Status: Normal   Collection Time   02/07/12  5:03 PM      Component Value Range Status Comment   MRSA by PCR NEGATIVE  NEGATIVE Final      Studies/Results: Ct Abdomen Pelvis W Contrast  02/07/2012  *RADIOLOGY REPORT*  Clinical Data: Abdominal pain  CT ABDOMEN AND PELVIS WITH CONTRAST  Technique:  Multidetector CT imaging of the abdomen and pelvis was performed following the standard protocol during bolus administration of intravenous contrast.  Contrast: 1 OMNIPAQUE IOHEXOL 300 MG/ML  SOLN, OMNIPAQUE IOHEXOL 300 MG/ML  SOLN  Comparison: 12/24/2011, 11/16/2005.  Findings: Trace right pleural effusion.  Mild bibasilar opacities. Small hiatal hernia.  Heart size upper normal.  Trace pericardial fluid.  Low attenuation of the liver is in keeping with fatty infiltration. The liver contour is lobular, nonspecific however similar to priors dating back to 2007.  There is dilatation of the common bile duct and central intrahepatic ducts.  The CBD measures up to 10 mm in diameter, tapering at the ampulla. Absent gallbladder.   Unremarkable spleen, pancreas, adrenal glands.  Symmetric renal enhancement.  No hydronephrosis or hydroureter.  No bowel obstruction.  No CT evidence for colitis.  Normal appendix.  No free intraperitoneal air or fluid.  No lymphadenopathy.  Normal caliber aorta and branch vessels.  Status post anterior abdominal wall hernia repair.  Thin-walled bladder.  CT appearance of the uterus within normal limits.  Left adnexa is similar to priors with three ovarian cysts measuring up to 2.2 cm.  No pelvic lymphadenopathy.  Multilevel degenerative changes of the imaged spine. No acute or aggressive appearing osseous lesion.  IMPRESSION: Mild right lung base opacity and associated trace pleural effusion may reflect pneumonia.  Hepatic steatosis.  Biliary ductal dilatation is similar to prior status post cholecystectomy.  Recommend LFT correlation and ERCP if warranted.  Nonspecific left ovarian cysts, similar to priors. Consider non emergent pelvic ultrasound follow-up.   Original Report Authenticated By: Waneta Martins, M.D.    Dg Chest Port 1 View  02/07/2012  *RADIOLOGY REPORT*  Clinical Data: Chest and abdominal pain.  PORTABLE CHEST - 1 VIEW  Comparison: 12/24/2011  Findings: Mild bibasilar opacities. Mildly prominent cardiomediastinal contours, similar to prior. May be accentuated by portable technique and hypoaeration.  No definite pleural effusion. No pneumothorax.  No acute osseous finding.  IMPRESSION: Mild bibasilar opacities; atelectasis versus infiltrate.   Original Report Authenticated By: Waneta Martins, M.D.     Medications: Scheduled Meds:    .  amLODipine  10 mg Oral Daily  . azithromycin  500 mg Oral Q24H  . cefTRIAXone (ROCEPHIN)  IV  1 g Intravenous Q24H  . cloNIDine  0.1 mg Oral TID  . enoxaparin  40 mg Subcutaneous Q24H  . famotidine  20 mg Oral Daily  . guaiFENesin  600 mg Oral BID  . hydrocortisone   Rectal TID  . insulin aspart  0-15 Units Subcutaneous TID WC  . insulin  aspart  0-5 Units Subcutaneous QHS  . insulin detemir  10 Units Subcutaneous Daily  . levothyroxine  25 mcg Oral Q0600  . methylPREDNISolone (SOLU-MEDROL) injection  60 mg Intravenous Q6H  . pantoprazole  40 mg Oral Q1200   Continuous Infusions:  PRN Meds:.acetaminophen, albuterol, hydrALAZINE, HYDROmorphone (DILAUDID) injection, pramoxine-mineral oil-zinc, simethicone, traMADol    LOS: 1 day   Michelle Harding,CHRISTOPHER  [facility] Pager [phone]. If 8PM-8AM, please contact night-coverage at [email or web address], password [facility] 02/08/2012, 1:29 PM  LOS: 1 day

## 2012-02-08 NOTE — Progress Notes (Signed)
Pt had an elevated CBG of 404. RN paged MD on call. Waiting for further orders.

## 2012-02-09 LAB — CBC
HCT: 32.7 % — ABNORMAL LOW (ref 36.0–46.0)
Hemoglobin: 10.9 g/dL — ABNORMAL LOW (ref 12.0–15.0)
MCHC: 33.3 g/dL (ref 30.0–36.0)
MCV: 88.6 fL (ref 78.0–100.0)
RDW: 14.8 % (ref 11.5–15.5)
WBC: 19.2 10*3/uL — ABNORMAL HIGH (ref 4.0–10.5)

## 2012-02-09 LAB — BASIC METABOLIC PANEL
BUN: 27 mg/dL — ABNORMAL HIGH (ref 6–23)
Chloride: 95 mEq/L — ABNORMAL LOW (ref 96–112)
Creatinine, Ser: 1.13 mg/dL — ABNORMAL HIGH (ref 0.50–1.10)
Glucose, Bld: 322 mg/dL — ABNORMAL HIGH (ref 70–99)
Potassium: 3.7 mEq/L (ref 3.5–5.1)

## 2012-02-09 LAB — GLUCOSE, CAPILLARY
Glucose-Capillary: 302 mg/dL — ABNORMAL HIGH (ref 70–99)
Glucose-Capillary: 304 mg/dL — ABNORMAL HIGH (ref 70–99)
Glucose-Capillary: 268 mg/dL — ABNORMAL HIGH (ref 70–99)

## 2012-02-09 MED ORDER — COLCHICINE 0.6 MG PO TABS
0.6000 mg | ORAL_TABLET | Freq: Every day | ORAL | Status: DC
  Filled 2012-02-09 (×2): qty 1

## 2012-02-09 MED ORDER — LEVOFLOXACIN 750 MG PO TABS
750.0000 mg | ORAL_TABLET | Freq: Every day | ORAL | Status: DC
  Administered 2012-02-09: 750 mg via ORAL
  Filled 2012-02-09 (×2): qty 1

## 2012-02-09 MED ORDER — ENOXAPARIN SODIUM 60 MG/0.6ML ~~LOC~~ SOLN
60.0000 mg | SUBCUTANEOUS | Status: DC
  Administered 2012-02-09 – 2012-02-10 (×2): 60 mg via SUBCUTANEOUS
  Filled 2012-02-09 (×3): qty 0.6

## 2012-02-09 NOTE — Progress Notes (Signed)
Inpatient Diabetes Program Recommendations  AACE/ADA: New Consensus Statement on Inpatient Glycemic Control (2013)  Target Ranges:  Prepandial:   less than 140 mg/dL      Peak postprandial:   less than 180 mg/dL (1-2 hours)      Critically ill patients:  140 - 180 mg/dL   Reason for Visit: Hyperglycemia  Pt eating 100% meals.  Results for CAMRI, MOLLOY (MRN [id number]) as of 02/09/2012 10:41  Ref. Range 02/08/2012 11:37 02/08/2012 17:15 02/08/2012 21:52 02/09/2012 05:22 02/09/2012 07:52  Glucose-Capillary Latest Range: 70-99 mg/dL 409 (H) 811 (H) 914 (H) 302 (H) 304 (H)  Results for JERITA, WIMBUSH (MRN [id number]) as of 02/09/2012 10:41  Ref. Range 02/07/2012 08:48  Hemoglobin A1C Latest Range: <5.7 % 7.8 (H)   Hyperglycemia X 3 days.  Recommendations  Increase Levemir to 20 units QAM. Increase meal coverage insulin to Novolog 8 units tidwc if pt eats >50% meal.  Will continue to follow for adjustments.  Thank you. Ailene Ards, RD, LDN, CDE [phone] - Pager

## 2012-02-09 NOTE — Progress Notes (Signed)
Pt HR sustaining at 40. Pt was asleep. Pt is asymptomatic.  RN paged MD on call. MD stated to continue to monitor pt. -Judy Pimple, RN

## 2012-02-09 NOTE — Progress Notes (Signed)
ANTIBIOTIC CONSULT NOTE - INITIAL  Pharmacy Consult for Levaquin Indication: CAP  Allergies  Allergen Reactions  . Aspirin     Patient says it hurts her stomach, has to be coaed  . Vicodin (Hydrocodone-Acetaminophen)     Patient says Vicodin makes her itch    Patient Measurements: Height: 5' 8.4" (173.7 cm) Weight: 256 lb 9.9 oz (116.4 kg) IBW/kg (Calculated) : 64.82   Vital Signs: Temp: 98.7 F (37.1 C) (10/02 0525) Temp src: Oral (10/02 0525) BP: 132/75 mmHg (10/02 0925) Pulse Rate: 62  (10/02 0525) Intake/Output from previous day: 10/01 0701 - 10/02 0700 In: 1080 [P.O.:1080] Out: -  Intake/Output from this shift:    Labs:  Basename 02/09/12 0645 02/08/12 1847 02/08/12 0505 02/07/12 0848  WBC 19.2* 16.5* 13.1* --  HGB 10.9* 10.2* 10.4* --  PLT 402* 351 310 --  LABCREA -- -- -- --  CREATININE 1.13* -- 0.87 0.82   Estimated Creatinine Clearance: 70.5 ml/min (by C-G formula based on Cr of 1.13). No results found for this basename: VANCOTROUGH:2,VANCOPEAK:2,VANCORANDOM:2,GENTTROUGH:2,GENTPEAK:2,GENTRANDOM:2,TOBRATROUGH:2,TOBRAPEAK:2,TOBRARND:2,AMIKACINPEAK:2,AMIKACINTROU:2,AMIKACIN:2, in the last 72 hours   Microbiology: Recent Results (from the past 720 hour(s))  MRSA PCR SCREENING     Status: Normal   Collection Time   02/07/12  5:03 PM      Component Value Range Status Comment   MRSA by PCR NEGATIVE  NEGATIVE Final     Medical History: Past Medical History  Diagnosis Date  . Hemorrhoids   . Asthma   . Hypertension   . Diabetes mellitus   . Ovarian cyst   . Heart disease   . IBS (irritable bowel syndrome)   . DJD (degenerative joint disease) of lumbar spine   . DJD (degenerative joint disease) of knee     Medications:  Prescriptions prior to admission  Medication Sig Dispense Refill  . albuterol (PROVENTIL HFA;VENTOLIN HFA) 108 (90 BASE) MCG/ACT inhaler Inhale 2 puffs into the lungs every 6 (six) hours as needed. For shortness of breath      .  amLODipine (NORVASC) 5 MG tablet Take 5 mg by mouth daily.      Marland Kitchen aspirin 325 MG tablet Take 325 mg by mouth every 6 (six) hours as needed. For pain      . cloNIDine (CATAPRES) 0.1 MG tablet Take 0.1 mg by mouth daily.      . colchicine 0.6 MG tablet Take 0.6 mg by mouth daily as needed. For gout      . insulin detemir (LEVEMIR) 100 UNIT/ML injection Inject 0.5 Units into the skin daily.      . insulin lispro protamine-insulin lispro (HUMALOG 50/50) (50-50) 100 UNIT/ML SUSP Inject 5-7 Units into the skin 3 (three) times daily before meals.      Marland Kitchen levothyroxine (SYNTHROID, LEVOTHROID) 25 MCG tablet Take 25 mcg by mouth daily.      . Misc Natural Products (COLON CLEANSER PO) Take 1 tablet by mouth daily as needed. For constipation      . omeprazole (PRILOSEC) 40 MG capsule Take 40 mg by mouth daily.      . ranitidine (ZANTAC) 150 MG tablet Take 150 mg by mouth daily.       Assessment: 61 y/o female patient admitted with pneumonia, started on rocephin/zithromax, now to change to levaquin. Remains afebrile, wbc wnl, and tolerating po ok .  Plan:  Levaquin 750mg  po q24h  Verlene Mayer, PharmD, BCPS Pager [phone] 02/09/2012,10:56 AM

## 2012-02-09 NOTE — Progress Notes (Signed)
[facility] PROGRESS NOTE  Michelle Harding ZOX:[id number] DOB: 1950-08-25 DOA: 02/07/2012 PCP: Sheila Oats, MD  Assessment/Plan: 1. Community acquired pneumonia: Patient presented with a week of productive cough, without pyrexia, and since 02/04/12, has had increasing RUQ abdominal pain. Imaging studies, including CXR and abdominal/pelvic CT scan, revealed infiltration in the RML/Lung base, suspicious for community-acquired pneumonia. - Improved clinically with no increased SOB but WBC trending up.  Will change antibiotics to oral levaquin 10/2.  Check CBC next AM. - Leukocytosis likely secondary to prednisone given no wheezing will plan on discontinuing.  2. Hypertension: This was sub-optimally controlled on pre-admission antihypertensives. While in house Norvasc increased to 10 mg daily, and Clonidine to 0.1 mg t.i.d, with improved control.   3. Diabetes mellitus: Insulin-requiring type 2. Still uncontrolled this AM, with CBG of 302. Managing with diet, SSI and Levemir. CBGs are still elevated today, possibly due to steroid treatment. Will d/c prednisone and recheck levels.  4. Gout attack: Patient appears to have a polyarticular flare up of gout, affecting bilateral wrists and right foot. Patient has history of GI bleed, so NSAIDS are contra-indicated. Patient was placed on parenteral steroids on 02/07/12, with dramatic response. Today, wrist swelling and tenderness has improved bilaterally.  Will place on colchicine 10/2 and reassess  5. Abdominal pain: This is probably referred pain from pneumonia. Abdominal/pelvic CT scan showed hepatic steatosis and biliary ductal dilatation is similar to prior status post cholecystectomy. No acute findings were in evidence.  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 She is due to see a gastroenterologist on 02/24/12. Will change PPI to b.i.d, and have urged her to keep her  appointment.   6. Hemorrhoids: Per patient, symptomatic last few days. Managing with Anusol cream.   Code Status: Full Code.  Family Communication: Discussed fully with patient.  Disposition Plan: To be determined.   Consultants:  None  Procedures:  CT of abdomen and pelvis  Antibiotics:  Levaquin po 10/2  HPI/Subjective: Patient with no complaints of SOB, states that her discomfort in her joints is feeling better.  No acute issue reported by patient overnight.  Objective: Filed Vitals:   02/08/12 1415 02/08/12 2146 02/09/12 0525 02/09/12 0925  BP: 105/71 146/81 155/75 132/75  Pulse: 57 57 62   Temp: 98.7 F (37.1 C) 97.8 F (36.6 C) 98.7 F (37.1 C)   TempSrc: Oral Oral Oral   Resp: 19 20 20    Height:      Weight:      SpO2: 96% 97% 97%     Intake/Output Summary (Last 24 hours) at 02/09/12 1156 Last data filed at 02/09/12 8119  Gross per 24 hour  Intake    840 ml  Output      0 ml  Net    840 ml   Filed Weights   02/07/12 0638  Weight: 116.4 kg (256 lb 9.9 oz)    Exam:   General:  Pt in NAD, Alert and O x 3  Cardiovascular: RRR, No MRG  Respiratory: no wheezes, no increased work of breathing  Abdomen: Soft, NT, ND  Data Reviewed: Basic Metabolic Panel:  Lab 02/09/12 1478 02/08/12 0505 02/07/12 0848 02/07/12 0013  NA 135 135 -- 136  K 3.7 3.8 -- 3.3*  CL 95* 95* -- 94*  CO2 24 27 -- 29  GLUCOSE 322* 383* -- 251*  BUN 27* 13 -- 13  CREATININE 1.13* 0.87 0.82 0.88  CALCIUM  10.1 9.3 -- 9.8  MG -- -- -- --  PHOS -- -- -- --   Liver Function Tests:  Lab 02/08/12 0505 02/07/12 0013  AST 10 15  ALT 8 11  ALKPHOS 63 62  BILITOT 0.6 0.6  PROT 7.0 7.3  ALBUMIN 3.0* 3.5    Lab 02/07/12 0143  LIPASE 24  AMYLASE --   No results found for this basename: AMMONIA:5 in the last 168 hours CBC:  Lab 02/09/12 0645 02/08/12 1847 02/08/12 0505 02/07/12 0848 02/07/12 0013  WBC 19.2* 16.5* 13.1* 12.5* 11.6*  NEUTROABS -- -- 11.7* -- 8.7*  HGB  10.9* 10.2* 10.4* 10.6* 11.4*  HCT 32.7* 30.1* 30.7* 32.1* 33.6*  MCV 88.6 88.5 88.2 89.7 88.9  PLT 402* 351 310 300 312   Cardiac Enzymes: No results found for this basename: CKTOTAL:5,CKMB:5,CKMBINDEX:5,TROPONINI:5 in the last 168 hours BNP (last 3 results) No results found for this basename: PROBNP:3 in the last 8760 hours CBG:  Lab 02/09/12 0752 02/09/12 0522 02/08/12 2152 02/08/12 1715 02/08/12 1137  GLUCAP 304* 302* 404* 406* 338*    Recent Results (from the past 240 hour(s))  MRSA PCR SCREENING     Status: Normal   Collection Time   02/07/12  5:03 PM      Component Value Range Status Comment   MRSA by PCR NEGATIVE  NEGATIVE Final      Studies: No results found.  Scheduled Meds:   . amLODipine  10 mg Oral Daily  . cloNIDine  0.1 mg Oral TID  . enoxaparin (LOVENOX) injection  60 mg Subcutaneous Q24H  . famotidine  20 mg Oral Daily  . guaiFENesin  600 mg Oral BID  . hydrocortisone   Rectal TID  . influenza  inactive virus vaccine  0.5 mL Intramuscular Tomorrow-1000  . insulin aspart  0-15 Units Subcutaneous TID WC  . insulin aspart  0-5 Units Subcutaneous QHS  . insulin aspart  4 Units Subcutaneous TID WC  . insulin detemir  15 Units Subcutaneous Daily  . levofloxacin  750 mg Oral q1800  . levothyroxine  25 mcg Oral Q0600  . methylPREDNISolone (SOLU-MEDROL) injection  60 mg Intravenous Q6H  . pantoprazole  40 mg Oral BID AC  . pneumococcal 23 valent vaccine  0.5 mL Intramuscular Tomorrow-1000  . predniSONE  40 mg Oral Q breakfast  . DISCONTD: azithromycin  500 mg Oral Q24H  . DISCONTD: cefTRIAXone (ROCEPHIN)  IV  1 g Intravenous Q24H  . DISCONTD: enoxaparin  40 mg Subcutaneous Q24H  . DISCONTD: insulin detemir  10 Units Subcutaneous Daily  . DISCONTD: pantoprazole  40 mg Oral Q1200   Continuous Infusions:   Principal Problem:  *Community acquired pneumonia Active Problems:  Hypertension  Diabetes mellitus  Gout attack    Time spent: > 35  minutes    Penny Pia  [facility] Pager [phone] If 8PM-8AM, please contact night-coverage at [email or web address], password [facility] 02/09/2012, 11:56 AM  LOS: 2 days

## 2012-02-10 DIAGNOSIS — R001 Bradycardia, unspecified: Secondary | ICD-10-CM

## 2012-02-10 DIAGNOSIS — I498 Other specified cardiac arrhythmias: Secondary | ICD-10-CM

## 2012-02-10 HISTORY — DX: Bradycardia, unspecified: R00.1

## 2012-02-10 LAB — CBC
MCH: 30 pg (ref 26.0–34.0)
MCHC: 34.1 g/dL (ref 30.0–36.0)
Platelets: 407 10*3/uL — ABNORMAL HIGH (ref 150–400)

## 2012-02-10 MED ORDER — INSULIN DETEMIR 100 UNIT/ML ~~LOC~~ SOLN: 15.0000 [IU] | Freq: Every day | SUBCUTANEOUS | Status: DC

## 2012-02-10 MED ORDER — CHLORTHALIDONE 25 MG PO TABS: 25.0000 mg | ORAL_TABLET | Freq: Every day | ORAL | Status: DC

## 2012-02-10 MED ORDER — LEVOTHYROXINE SODIUM 25 MCG PO TABS: 25.0000 ug | ORAL_TABLET | Freq: Every day | ORAL | Status: DC

## 2012-02-10 MED ORDER — HYDROCORTISONE 2.5 % RE CREA: TOPICAL_CREAM | Freq: Three times a day (TID) | RECTAL | Status: DC

## 2012-02-10 MED ORDER — CHLORTHALIDONE 25 MG PO TABS
25.0000 mg | ORAL_TABLET | Freq: Every day | ORAL | Status: DC
  Filled 2012-02-10: qty 1

## 2012-02-10 MED ORDER — LEVOFLOXACIN 500 MG PO TABS: 500.0000 mg | ORAL_TABLET | Freq: Every day | ORAL | Status: DC

## 2012-02-10 MED ORDER — COLCHICINE 0.6 MG PO TABS: 0.6000 mg | ORAL_TABLET | Freq: Every day | ORAL | Status: DC

## 2012-02-10 MED ORDER — AMLODIPINE BESYLATE 10 MG PO TABS: 10.0000 mg | ORAL_TABLET | Freq: Every day | ORAL | Status: DC

## 2012-02-10 MED ORDER — TRAMADOL HCL 50 MG PO TABS: 25.0000 mg | ORAL_TABLET | Freq: Four times a day (QID) | ORAL | Status: DC | PRN

## 2012-02-10 NOTE — Care Management Note (Signed)
    Page 1 of 1   02/10/2012     3:53:34 PM   CARE MANAGEMENT NOTE 02/10/2012  Patient:  Michelle Harding, Michelle Harding   Account Number:  [id number]  Date Initiated:  02/10/2012  Documentation initiated by:  Letha Cape  Subjective/Objective Assessment:   dx cap  admit- lives alone. pta independent.     Action/Plan:   Anticipated DC Date:  02/10/2012   Anticipated DC Plan:  HOME/SELF CARE      DC Planning Services  CM consult      Choice offered to / List presented to:             Status of service:  Completed, signed off Medicare Important Message given?   (If response is "NO", the following Medicare IM given date fields will be blank) Date Medicare IM given:   Date Additional Medicare IM given:    Discharge Disposition:  HOME/SELF CARE  Per UR Regulation:  Reviewed for med. necessity/level of care/duration of stay  If discussed at Long Length of Stay Meetings, dates discussed:    Comments:  02/10/12 15:51 Letha Cape RN, BSN [phone] patient lives alone, pta independent. Patient states she may need a rolling walker, her friend in the room states she has a rolling walker she can use from her back surgery. Patient states she has medication coverage and transportation.  No needs identified.

## 2012-02-10 NOTE — Progress Notes (Signed)
Pt. discharge to floor,verbalized understanding of discharged instruction,medication,restriction,diet and follow up appointment.Baseline Vitals sign stable,Pt comfortable,no sign and symptom of distress. 

## 2012-02-10 NOTE — Progress Notes (Signed)
Pt bp low in 40s. PT has been running in the 40s all day. Pt is asymptomatic.  MD on call notified and stated to hold pt's nightly dose of clonidine and pt also refused. RN to continue to monitor pt.

## 2012-02-10 NOTE — Discharge Summary (Signed)
Physician Discharge Summary  Michelle Harding ZOX:[id number] DOB: 1950/07/28 DOA: 02/07/2012  PCP: Sheila Oats, MD  Admit date: 02/07/2012 Discharge date: 02/10/2012  Recommendations for Outpatient Follow-up:  1. Please follow up on patient's blood pressure and heart rate.   2. Also follow up with patient's WBC count and respiratory status adjust antibiotics as needed based on outpatient follow up examination.  3. Pt to follow up with PCP in 1-2 weeks or sooner should any new concerns arise.  Discharge Diagnoses:  Principal Problem:  *Community acquired pneumonia Active Problems:  Hypertension  Diabetes mellitus  Gout attack Bradycardia  Discharge Condition: stable  Diet recommendation: diabetic diet  Filed Weights   02/07/12 0638  Weight: 116.4 kg (256 lb 9.9 oz)    History of present illness:  From original HPI: Michelle Harding is a 61 y.o. female  has a past medical history of Hemorrhoids; Asthma; Hypertension; Diabetes mellitus; Ovarian cyst; Heart disease; IBS (irritable bowel syndrome); DJD (degenerative joint disease) of lumbar spine; and DJD (degenerative joint disease) of knee.  Presented with  She has had abdominal pain since Friday and have had trouble with hemorrhoids. She was found to have incidental Pneumonia. Patient also have had swelling in her right hand and right foot typical for her gout attacks. She has been coughing up mucus since last week.   Hospital Course:  1. Community acquired pneumonia: Patient presented with a week of productive cough, without pyrexia, and since 02/04/12, has had increasing RUQ abdominal pain. Imaging studies, including CXR and abdominal/pelvic CT scan, revealed infiltration in the RML/Lung base, suspicious for community-acquired pneumonia.  - Improved clinically with no increased SOB but WBC trending up. Changed antibiotics to oral levaquin 10/2. Treat for a total of 8 days with antibiotics - Leukocytosis likely secondary to prednisone  and has decreased after cessation of steroid as well as antibiotic change.  2. Hypertension: This was sub-optimally controlled on pre-admission antihypertensives. While in house Norvasc increased to 10 mg daily.  Clonidine will be discontinued since patient has had bradycardia.  Will add chlorthalidone to help control blood pressure.  Patient is to get this followed up with her cardiologist.  3. Diabetes mellitus: Likely uncontrolled due to infection and prednisone use.  At this point will place back on home regimen but will increased her long acting insulin to 15 units Q day.  Will continue to recommend diabetic diet and log of blood sugars with pcp follow up for further adjustment in home regimen.  4. Gout attack: Patient appears to have a polyarticular flare up of gout, affecting bilateral wrists and right foot. Patient has history of GI bleed, so NSAIDS are contra-indicated. Patient was placed on parenteral steroids on 02/07/12, with dramatic response. Today, wrist swelling and tenderness has improved bilaterally. Will place on colchicine 10/2 and maintain on this regimen.   5. Abdominal pain: This is probably referred pain from pneumonia. Abdominal/pelvic CT scan showed hepatic steatosis and biliary ductal dilatation is similar to prior status post cholecystectomy. No acute findings were in evidence. 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 She is due to see a gastroenterologist on 02/24/12. Will continue home regimen and have urged her to keep her appointment with her GI doctor.   6. Hemorrhoids: Per patient, symptomatic last few days. Managing with Anusol cream.   7. Bradycardia:  12 lead EKG obtained which showed sinus bradycardia.  Will have patient follow up with her cardiologist and will  discontinue clonidine.  Procedures:  LE duplex- negative for DVT  Consultations:  None: tried to reach her cardiologist Dr.  Denice Paradise but was unable to speak to him.  Discharge Exam: Filed Vitals:   02/10/12 0518 02/10/12 0539 02/10/12 0919 02/10/12 1009  BP: 167/82 155/80 155/78 124/72  Pulse: 45   50  Temp: 98.2 F (36.8 C)   98.3 F (36.8 C)  TempSrc: Oral   Oral  Resp: 20   20  Height:      Weight:      SpO2: 99%   99%    General: Pt in NAD, A and O x 3 Cardiovascular: normal S1 and S 2, no MRG Respiratory: CTA BL, no wheezes  Discharge Instructions  Discharge Orders    Future Orders Please Complete By Expires   Diet - low sodium heart healthy      Increase activity slowly      Discharge instructions      Comments:   You had sinus bradycardia and as such your clonidine was discontinued.  You will need to follow up with your cardiologist for further evaluation and recommendations in 1-2 weeks or sooner should you have any new concerns.   Call MD for:  temperature >100.4      Call MD for:  severe uncontrolled pain      Call MD for:  persistant dizziness or light-headedness          Medication List     As of 02/10/2012  1:07 PM    STOP taking these medications         aspirin 325 MG tablet      cloNIDine 0.1 MG tablet   Commonly known as: CATAPRES      TAKE these medications         albuterol 108 (90 BASE) MCG/ACT inhaler   Commonly known as: PROVENTIL HFA;VENTOLIN HFA   Inhale 2 puffs into the lungs every 6 (six) hours as needed. For shortness of breath      amLODipine 10 MG tablet   Commonly known as: NORVASC   Take 1 tablet (10 mg total) by mouth daily.      chlorthalidone 25 MG tablet   Commonly known as: HYGROTON   Take 1 tablet (25 mg total) by mouth daily.      colchicine 0.6 MG tablet   Take 0.6 mg by mouth daily as needed. For gout      colchicine 0.6 MG tablet   Take 1 tablet (0.6 mg total) by mouth daily.      COLON CLEANSER PO   Take 1 tablet by mouth daily as needed. For constipation      hydrocortisone 2.5 % rectal cream   Commonly known as: ANUSOL-HC    Place rectally 3 (three) times daily.      insulin detemir 100 UNIT/ML injection   Commonly known as: LEVEMIR   Inject 15 Units into the skin daily.      insulin lispro protamine-insulin lispro (50-50) 100 UNIT/ML Susp   Commonly known as: HUMALOG 50/50   Inject 5-7 Units into the skin 3 (three) times daily before meals.      levofloxacin 500 MG tablet   Commonly known as: LEVAQUIN   Take 1 tablet (500 mg total) by mouth daily.      levothyroxine 25 MCG tablet   Commonly known as: SYNTHROID, LEVOTHROID   Take 25 mcg by mouth daily.      omeprazole 40 MG capsule  Commonly known as: PRILOSEC   Take 40 mg by mouth daily.      ranitidine 150 MG tablet   Commonly known as: ZANTAC   Take 150 mg by mouth daily.      traMADol 50 MG tablet   Commonly known as: ULTRAM   Take 0.5 tablets (25 mg total) by mouth every 6 (six) hours as needed for pain.          The results of significant diagnostics from this hospitalization (including imaging, microbiology, ancillary and laboratory) are listed below for reference.    Significant Diagnostic Studies: Ct Abdomen Pelvis W Contrast  02/07/2012  *RADIOLOGY REPORT*  Clinical Data: Abdominal pain  CT ABDOMEN AND PELVIS WITH CONTRAST  Technique:  Multidetector CT imaging of the abdomen and pelvis was performed following the standard protocol during bolus administration of intravenous contrast.  Contrast: 1 OMNIPAQUE IOHEXOL 300 MG/ML  SOLN, OMNIPAQUE IOHEXOL 300 MG/ML  SOLN  Comparison: 12/24/2011, 11/16/2005.  Findings: Trace right pleural effusion.  Mild bibasilar opacities. Small hiatal hernia.  Heart size upper normal.  Trace pericardial fluid.  Low attenuation of the liver is in keeping with fatty infiltration. The liver contour is lobular, nonspecific however similar to priors dating back to 2007.  There is dilatation of the common bile duct and central intrahepatic ducts.  The CBD measures up to 10 mm in diameter, tapering at the  ampulla. Absent gallbladder.  Unremarkable spleen, pancreas, adrenal glands.  Symmetric renal enhancement.  No hydronephrosis or hydroureter.  No bowel obstruction.  No CT evidence for colitis.  Normal appendix.  No free intraperitoneal air or fluid.  No lymphadenopathy.  Normal caliber aorta and branch vessels.  Status post anterior abdominal wall hernia repair.  Thin-walled bladder.  CT appearance of the uterus within normal limits.  Left adnexa is similar to priors with three ovarian cysts measuring up to 2.2 cm.  No pelvic lymphadenopathy.  Multilevel degenerative changes of the imaged spine. No acute or aggressive appearing osseous lesion.  IMPRESSION: Mild right lung base opacity and associated trace pleural effusion may reflect pneumonia.  Hepatic steatosis.  Biliary ductal dilatation is similar to prior status post cholecystectomy.  Recommend LFT correlation and ERCP if warranted.  Nonspecific left ovarian cysts, similar to priors. Consider non emergent pelvic ultrasound follow-up.   Original Report Authenticated By: Waneta Martins, M.D.    Dg Chest Port 1 View  02/07/2012  *RADIOLOGY REPORT*  Clinical Data: Chest and abdominal pain.  PORTABLE CHEST - 1 VIEW  Comparison: 12/24/2011  Findings: Mild bibasilar opacities. Mildly prominent cardiomediastinal contours, similar to prior. May be accentuated by portable technique and hypoaeration.  No definite pleural effusion. No pneumothorax.  No acute osseous finding.  IMPRESSION: Mild bibasilar opacities; atelectasis versus infiltrate.   Original Report Authenticated By: Waneta Martins, M.D.     Microbiology: Recent Results (from the past 240 hour(s))  MRSA PCR SCREENING     Status: Normal   Collection Time   02/07/12  5:03 PM      Component Value Range Status Comment   MRSA by PCR NEGATIVE  NEGATIVE Final      Labs: Basic Metabolic Panel:  Lab 02/09/12 1610 02/08/12 0505 02/07/12 0848 02/07/12 0013  NA 135 135 -- 136  K 3.7 3.8 -- 3.3*    CL 95* 95* -- 94*  CO2 24 27 -- 29  GLUCOSE 322* 383* -- 251*  BUN 27* 13 -- 13  CREATININE 1.13* 0.87 0.82 0.88  CALCIUM 10.1 9.3 -- 9.8  MG -- -- -- --  PHOS -- -- -- --   Liver Function Tests:  Lab 02/08/12 0505 02/07/12 0013  AST 10 15  ALT 8 11  ALKPHOS 63 62  BILITOT 0.6 0.6  PROT 7.0 7.3  ALBUMIN 3.0* 3.5    Lab 02/07/12 0143  LIPASE 24  AMYLASE --   No results found for this basename: AMMONIA:5 in the last 168 hours CBC:  Lab 02/10/12 0530 02/09/12 0645 02/08/12 1847 02/08/12 0505 02/07/12 0848 02/07/12 0013  WBC 12.9* 19.2* 16.5* 13.1* 12.5* --  NEUTROABS -- -- -- 11.7* -- 8.7*  HGB 10.4* 10.9* 10.2* 10.4* 10.6* --  HCT 30.5* 32.7* 30.1* 30.7* 32.1* --  MCV 87.9 88.6 88.5 88.2 89.7 --  PLT 407* 402* 351 310 300 --   Cardiac Enzymes: No results found for this basename: CKTOTAL:5,CKMB:5,CKMBINDEX:5,TROPONINI:5 in the last 168 hours BNP: BNP (last 3 results) No results found for this basename: PROBNP:3 in the last 8760 hours CBG:  Lab 02/10/12 1153 02/10/12 0722 02/09/12 2203 02/09/12 1718 02/09/12 1203  GLUCAP 157* 160* 144* 268* 291*    Time coordinating discharge: 35 minutes  Signed:  Penny Pia  [facility] 02/10/2012, 1:07 PM

## 2012-04-27 HISTORY — PX: COLONOSCOPY: SHX174

## 2013-03-10 HISTORY — PX: CATARACT EXTRACTION: SUR2

## 2013-05-10 HISTORY — PX: CATARACT EXTRACTION: SUR2

## 2014-05-21 DIAGNOSIS — E1142 Type 2 diabetes mellitus with diabetic polyneuropathy: Secondary | ICD-10-CM | POA: Diagnosis not present

## 2014-05-21 DIAGNOSIS — G43909 Migraine, unspecified, not intractable, without status migrainosus: Secondary | ICD-10-CM | POA: Diagnosis not present

## 2014-05-21 DIAGNOSIS — M545 Low back pain: Secondary | ICD-10-CM | POA: Diagnosis not present

## 2014-05-21 DIAGNOSIS — D329 Benign neoplasm of meninges, unspecified: Secondary | ICD-10-CM | POA: Diagnosis not present

## 2014-05-21 DIAGNOSIS — M5416 Radiculopathy, lumbar region: Secondary | ICD-10-CM | POA: Diagnosis not present

## 2014-05-21 DIAGNOSIS — H532 Diplopia: Secondary | ICD-10-CM | POA: Diagnosis not present

## 2014-05-30 DIAGNOSIS — M5127 Other intervertebral disc displacement, lumbosacral region: Secondary | ICD-10-CM | POA: Diagnosis not present

## 2014-05-30 DIAGNOSIS — M5136 Other intervertebral disc degeneration, lumbar region: Secondary | ICD-10-CM | POA: Diagnosis not present

## 2014-06-13 DIAGNOSIS — D329 Benign neoplasm of meninges, unspecified: Secondary | ICD-10-CM | POA: Diagnosis not present

## 2014-07-11 DIAGNOSIS — D329 Benign neoplasm of meninges, unspecified: Secondary | ICD-10-CM | POA: Diagnosis not present

## 2014-07-11 DIAGNOSIS — Z510 Encounter for antineoplastic radiation therapy: Secondary | ICD-10-CM | POA: Diagnosis not present

## 2014-07-11 DIAGNOSIS — D320 Benign neoplasm of cerebral meninges: Secondary | ICD-10-CM | POA: Diagnosis not present

## 2014-07-11 DIAGNOSIS — G500 Trigeminal neuralgia: Secondary | ICD-10-CM | POA: Insufficient documentation

## 2014-08-21 DIAGNOSIS — G8929 Other chronic pain: Secondary | ICD-10-CM | POA: Diagnosis not present

## 2014-08-21 DIAGNOSIS — M545 Low back pain: Secondary | ICD-10-CM | POA: Diagnosis not present

## 2014-08-21 DIAGNOSIS — M5416 Radiculopathy, lumbar region: Secondary | ICD-10-CM | POA: Diagnosis not present

## 2014-08-21 DIAGNOSIS — G43909 Migraine, unspecified, not intractable, without status migrainosus: Secondary | ICD-10-CM | POA: Diagnosis not present

## 2014-08-21 DIAGNOSIS — E1142 Type 2 diabetes mellitus with diabetic polyneuropathy: Secondary | ICD-10-CM | POA: Diagnosis not present

## 2014-08-21 DIAGNOSIS — D329 Benign neoplasm of meninges, unspecified: Secondary | ICD-10-CM | POA: Diagnosis not present

## 2014-08-21 DIAGNOSIS — H532 Diplopia: Secondary | ICD-10-CM | POA: Diagnosis not present

## 2014-08-21 DIAGNOSIS — M5126 Other intervertebral disc displacement, lumbar region: Secondary | ICD-10-CM | POA: Diagnosis not present

## 2014-09-10 DIAGNOSIS — E119 Type 2 diabetes mellitus without complications: Secondary | ICD-10-CM | POA: Diagnosis not present

## 2014-09-10 DIAGNOSIS — Z79899 Other long term (current) drug therapy: Secondary | ICD-10-CM | POA: Diagnosis not present

## 2014-09-10 DIAGNOSIS — K219 Gastro-esophageal reflux disease without esophagitis: Secondary | ICD-10-CM | POA: Diagnosis not present

## 2014-09-10 DIAGNOSIS — I10 Essential (primary) hypertension: Secondary | ICD-10-CM | POA: Diagnosis not present

## 2014-09-10 DIAGNOSIS — Z1389 Encounter for screening for other disorder: Secondary | ICD-10-CM | POA: Diagnosis not present

## 2014-09-10 DIAGNOSIS — E782 Mixed hyperlipidemia: Secondary | ICD-10-CM | POA: Diagnosis not present

## 2014-09-10 DIAGNOSIS — E039 Hypothyroidism, unspecified: Secondary | ICD-10-CM | POA: Diagnosis not present

## 2014-09-10 DIAGNOSIS — J449 Chronic obstructive pulmonary disease, unspecified: Secondary | ICD-10-CM | POA: Diagnosis not present

## 2014-09-23 DIAGNOSIS — Z79899 Other long term (current) drug therapy: Secondary | ICD-10-CM | POA: Diagnosis not present

## 2014-09-23 DIAGNOSIS — M5126 Other intervertebral disc displacement, lumbar region: Secondary | ICD-10-CM | POA: Diagnosis not present

## 2014-09-23 DIAGNOSIS — M545 Low back pain: Secondary | ICD-10-CM | POA: Diagnosis not present

## 2014-09-23 DIAGNOSIS — M5416 Radiculopathy, lumbar region: Secondary | ICD-10-CM | POA: Diagnosis not present

## 2014-09-23 DIAGNOSIS — E1142 Type 2 diabetes mellitus with diabetic polyneuropathy: Secondary | ICD-10-CM | POA: Diagnosis not present

## 2014-09-23 DIAGNOSIS — D329 Benign neoplasm of meninges, unspecified: Secondary | ICD-10-CM | POA: Diagnosis not present

## 2014-09-23 DIAGNOSIS — G8929 Other chronic pain: Secondary | ICD-10-CM | POA: Diagnosis not present

## 2014-09-23 DIAGNOSIS — G43909 Migraine, unspecified, not intractable, without status migrainosus: Secondary | ICD-10-CM | POA: Diagnosis not present

## 2014-12-10 DIAGNOSIS — E119 Type 2 diabetes mellitus without complications: Secondary | ICD-10-CM | POA: Diagnosis not present

## 2014-12-12 DIAGNOSIS — M5126 Other intervertebral disc displacement, lumbar region: Secondary | ICD-10-CM | POA: Diagnosis not present

## 2014-12-12 DIAGNOSIS — R600 Localized edema: Secondary | ICD-10-CM | POA: Diagnosis not present

## 2014-12-12 DIAGNOSIS — M545 Low back pain: Secondary | ICD-10-CM | POA: Diagnosis not present

## 2014-12-12 DIAGNOSIS — M5416 Radiculopathy, lumbar region: Secondary | ICD-10-CM | POA: Diagnosis not present

## 2014-12-12 DIAGNOSIS — G43009 Migraine without aura, not intractable, without status migrainosus: Secondary | ICD-10-CM | POA: Diagnosis not present

## 2014-12-12 DIAGNOSIS — Z79899 Other long term (current) drug therapy: Secondary | ICD-10-CM | POA: Diagnosis not present

## 2014-12-12 DIAGNOSIS — G8929 Other chronic pain: Secondary | ICD-10-CM | POA: Diagnosis not present

## 2014-12-12 DIAGNOSIS — M1990 Unspecified osteoarthritis, unspecified site: Secondary | ICD-10-CM | POA: Diagnosis not present

## 2014-12-12 DIAGNOSIS — M1A9XX0 Chronic gout, unspecified, without tophus (tophi): Secondary | ICD-10-CM | POA: Diagnosis not present

## 2014-12-12 DIAGNOSIS — E1142 Type 2 diabetes mellitus with diabetic polyneuropathy: Secondary | ICD-10-CM | POA: Diagnosis not present

## 2015-01-13 DIAGNOSIS — R1084 Generalized abdominal pain: Secondary | ICD-10-CM | POA: Diagnosis not present

## 2015-01-13 DIAGNOSIS — R531 Weakness: Secondary | ICD-10-CM | POA: Diagnosis not present

## 2015-01-13 DIAGNOSIS — Z794 Long term (current) use of insulin: Secondary | ICD-10-CM | POA: Diagnosis not present

## 2015-01-13 DIAGNOSIS — N838 Other noninflammatory disorders of ovary, fallopian tube and broad ligament: Secondary | ICD-10-CM | POA: Diagnosis not present

## 2015-01-13 DIAGNOSIS — R197 Diarrhea, unspecified: Secondary | ICD-10-CM | POA: Diagnosis not present

## 2015-01-13 DIAGNOSIS — K838 Other specified diseases of biliary tract: Secondary | ICD-10-CM | POA: Diagnosis not present

## 2015-01-13 DIAGNOSIS — I10 Essential (primary) hypertension: Secondary | ICD-10-CM | POA: Diagnosis not present

## 2015-01-13 DIAGNOSIS — J811 Chronic pulmonary edema: Secondary | ICD-10-CM | POA: Diagnosis not present

## 2015-01-13 DIAGNOSIS — E119 Type 2 diabetes mellitus without complications: Secondary | ICD-10-CM | POA: Diagnosis not present

## 2015-01-13 DIAGNOSIS — Z79899 Other long term (current) drug therapy: Secondary | ICD-10-CM | POA: Diagnosis not present

## 2015-01-15 DIAGNOSIS — E039 Hypothyroidism, unspecified: Secondary | ICD-10-CM | POA: Diagnosis not present

## 2015-01-15 DIAGNOSIS — N838 Other noninflammatory disorders of ovary, fallopian tube and broad ligament: Secondary | ICD-10-CM | POA: Diagnosis not present

## 2015-01-15 DIAGNOSIS — R1032 Left lower quadrant pain: Secondary | ICD-10-CM | POA: Diagnosis not present

## 2015-01-15 DIAGNOSIS — R600 Localized edema: Secondary | ICD-10-CM | POA: Diagnosis not present

## 2015-01-15 DIAGNOSIS — Z09 Encounter for follow-up examination after completed treatment for conditions other than malignant neoplasm: Secondary | ICD-10-CM | POA: Diagnosis not present

## 2015-01-15 DIAGNOSIS — E876 Hypokalemia: Secondary | ICD-10-CM | POA: Diagnosis not present

## 2015-01-15 DIAGNOSIS — E782 Mixed hyperlipidemia: Secondary | ICD-10-CM | POA: Diagnosis not present

## 2015-01-15 DIAGNOSIS — I10 Essential (primary) hypertension: Secondary | ICD-10-CM | POA: Diagnosis not present

## 2015-01-15 DIAGNOSIS — E119 Type 2 diabetes mellitus without complications: Secondary | ICD-10-CM | POA: Diagnosis not present

## 2015-01-15 DIAGNOSIS — J309 Allergic rhinitis, unspecified: Secondary | ICD-10-CM | POA: Diagnosis not present

## 2015-01-22 DIAGNOSIS — N838 Other noninflammatory disorders of ovary, fallopian tube and broad ligament: Secondary | ICD-10-CM | POA: Diagnosis not present

## 2015-02-12 DIAGNOSIS — M5126 Other intervertebral disc displacement, lumbar region: Secondary | ICD-10-CM | POA: Diagnosis not present

## 2015-02-12 DIAGNOSIS — G8929 Other chronic pain: Secondary | ICD-10-CM | POA: Diagnosis not present

## 2015-02-12 DIAGNOSIS — E1142 Type 2 diabetes mellitus with diabetic polyneuropathy: Secondary | ICD-10-CM | POA: Diagnosis not present

## 2015-02-12 DIAGNOSIS — G43009 Migraine without aura, not intractable, without status migrainosus: Secondary | ICD-10-CM | POA: Diagnosis not present

## 2015-02-12 DIAGNOSIS — M5416 Radiculopathy, lumbar region: Secondary | ICD-10-CM | POA: Diagnosis not present

## 2015-02-12 DIAGNOSIS — M545 Low back pain: Secondary | ICD-10-CM | POA: Diagnosis not present

## 2015-03-03 DIAGNOSIS — Z1151 Encounter for screening for human papillomavirus (HPV): Secondary | ICD-10-CM | POA: Diagnosis not present

## 2015-03-03 DIAGNOSIS — N72 Inflammatory disease of cervix uteri: Secondary | ICD-10-CM | POA: Diagnosis not present

## 2015-03-03 DIAGNOSIS — Z01419 Encounter for gynecological examination (general) (routine) without abnormal findings: Secondary | ICD-10-CM | POA: Diagnosis not present

## 2015-03-03 DIAGNOSIS — R1900 Intra-abdominal and pelvic swelling, mass and lump, unspecified site: Secondary | ICD-10-CM | POA: Diagnosis not present

## 2015-03-12 DIAGNOSIS — D399 Neoplasm of uncertain behavior of female genital organ, unspecified: Secondary | ICD-10-CM | POA: Diagnosis not present

## 2015-03-12 DIAGNOSIS — N852 Hypertrophy of uterus: Secondary | ICD-10-CM | POA: Diagnosis not present

## 2015-03-12 DIAGNOSIS — R1900 Intra-abdominal and pelvic swelling, mass and lump, unspecified site: Secondary | ICD-10-CM | POA: Diagnosis not present

## 2015-03-12 DIAGNOSIS — K469 Unspecified abdominal hernia without obstruction or gangrene: Secondary | ICD-10-CM | POA: Diagnosis not present

## 2015-03-20 DIAGNOSIS — K469 Unspecified abdominal hernia without obstruction or gangrene: Secondary | ICD-10-CM | POA: Diagnosis not present

## 2015-04-11 DIAGNOSIS — Z01818 Encounter for other preprocedural examination: Secondary | ICD-10-CM | POA: Diagnosis not present

## 2015-04-15 DIAGNOSIS — Z79899 Other long term (current) drug therapy: Secondary | ICD-10-CM | POA: Diagnosis not present

## 2015-04-15 DIAGNOSIS — E119 Type 2 diabetes mellitus without complications: Secondary | ICD-10-CM | POA: Diagnosis not present

## 2015-04-15 DIAGNOSIS — I10 Essential (primary) hypertension: Secondary | ICD-10-CM | POA: Diagnosis not present

## 2015-04-15 DIAGNOSIS — E039 Hypothyroidism, unspecified: Secondary | ICD-10-CM | POA: Diagnosis not present

## 2015-04-15 DIAGNOSIS — E559 Vitamin D deficiency, unspecified: Secondary | ICD-10-CM | POA: Diagnosis not present

## 2015-04-15 DIAGNOSIS — Z6841 Body Mass Index (BMI) 40.0 and over, adult: Secondary | ICD-10-CM | POA: Diagnosis not present

## 2015-04-15 DIAGNOSIS — J449 Chronic obstructive pulmonary disease, unspecified: Secondary | ICD-10-CM | POA: Diagnosis not present

## 2015-04-15 DIAGNOSIS — Z01818 Encounter for other preprocedural examination: Secondary | ICD-10-CM | POA: Diagnosis not present

## 2015-04-21 DIAGNOSIS — Z780 Asymptomatic menopausal state: Secondary | ICD-10-CM | POA: Diagnosis not present

## 2015-04-21 DIAGNOSIS — Z9049 Acquired absence of other specified parts of digestive tract: Secondary | ICD-10-CM | POA: Diagnosis not present

## 2015-04-21 DIAGNOSIS — K449 Diaphragmatic hernia without obstruction or gangrene: Secondary | ICD-10-CM | POA: Diagnosis not present

## 2015-04-21 DIAGNOSIS — K760 Fatty (change of) liver, not elsewhere classified: Secondary | ICD-10-CM | POA: Diagnosis not present

## 2015-04-21 DIAGNOSIS — R1900 Intra-abdominal and pelvic swelling, mass and lump, unspecified site: Secondary | ICD-10-CM | POA: Diagnosis not present

## 2015-04-21 DIAGNOSIS — M47816 Spondylosis without myelopathy or radiculopathy, lumbar region: Secondary | ICD-10-CM | POA: Diagnosis not present

## 2015-04-24 DIAGNOSIS — I2510 Atherosclerotic heart disease of native coronary artery without angina pectoris: Secondary | ICD-10-CM | POA: Insufficient documentation

## 2015-04-24 DIAGNOSIS — Z794 Long term (current) use of insulin: Secondary | ICD-10-CM | POA: Diagnosis not present

## 2015-04-24 DIAGNOSIS — I10 Essential (primary) hypertension: Secondary | ICD-10-CM | POA: Diagnosis not present

## 2015-04-24 DIAGNOSIS — E785 Hyperlipidemia, unspecified: Secondary | ICD-10-CM | POA: Insufficient documentation

## 2015-04-24 DIAGNOSIS — Z01818 Encounter for other preprocedural examination: Secondary | ICD-10-CM | POA: Diagnosis not present

## 2015-04-24 DIAGNOSIS — E119 Type 2 diabetes mellitus without complications: Secondary | ICD-10-CM | POA: Diagnosis not present

## 2015-04-24 HISTORY — DX: Hyperlipidemia, unspecified: E78.5

## 2015-04-24 HISTORY — DX: Atherosclerotic heart disease of native coronary artery without angina pectoris: I25.10

## 2015-05-01 DIAGNOSIS — Z1231 Encounter for screening mammogram for malignant neoplasm of breast: Secondary | ICD-10-CM | POA: Diagnosis not present

## 2015-05-14 DIAGNOSIS — I10 Essential (primary) hypertension: Secondary | ICD-10-CM | POA: Diagnosis not present

## 2015-05-14 DIAGNOSIS — I2510 Atherosclerotic heart disease of native coronary artery without angina pectoris: Secondary | ICD-10-CM | POA: Diagnosis not present

## 2015-07-07 DIAGNOSIS — D399 Neoplasm of uncertain behavior of female genital organ, unspecified: Secondary | ICD-10-CM | POA: Diagnosis not present

## 2015-07-07 DIAGNOSIS — Z01812 Encounter for preprocedural laboratory examination: Secondary | ICD-10-CM | POA: Diagnosis not present

## 2015-07-07 DIAGNOSIS — N8500 Endometrial hyperplasia, unspecified: Secondary | ICD-10-CM | POA: Diagnosis not present

## 2015-07-08 DIAGNOSIS — Z01812 Encounter for preprocedural laboratory examination: Secondary | ICD-10-CM | POA: Diagnosis not present

## 2015-07-10 DIAGNOSIS — N83202 Unspecified ovarian cyst, left side: Secondary | ICD-10-CM | POA: Diagnosis not present

## 2015-07-10 DIAGNOSIS — E669 Obesity, unspecified: Secondary | ICD-10-CM | POA: Diagnosis not present

## 2015-07-10 DIAGNOSIS — E119 Type 2 diabetes mellitus without complications: Secondary | ICD-10-CM | POA: Diagnosis not present

## 2015-07-10 DIAGNOSIS — J45909 Unspecified asthma, uncomplicated: Secondary | ICD-10-CM | POA: Diagnosis not present

## 2015-07-10 DIAGNOSIS — E785 Hyperlipidemia, unspecified: Secondary | ICD-10-CM | POA: Diagnosis not present

## 2015-07-10 DIAGNOSIS — N736 Female pelvic peritoneal adhesions (postinfective): Secondary | ICD-10-CM | POA: Diagnosis not present

## 2015-07-10 DIAGNOSIS — G4730 Sleep apnea, unspecified: Secondary | ICD-10-CM | POA: Diagnosis not present

## 2015-07-10 DIAGNOSIS — Z79899 Other long term (current) drug therapy: Secondary | ICD-10-CM | POA: Diagnosis not present

## 2015-07-10 DIAGNOSIS — D271 Benign neoplasm of left ovary: Secondary | ICD-10-CM | POA: Diagnosis not present

## 2015-07-10 DIAGNOSIS — K660 Peritoneal adhesions (postprocedural) (postinfection): Secondary | ICD-10-CM | POA: Diagnosis not present

## 2015-07-10 DIAGNOSIS — I10 Essential (primary) hypertension: Secondary | ICD-10-CM | POA: Diagnosis not present

## 2015-07-10 DIAGNOSIS — I2510 Atherosclerotic heart disease of native coronary artery without angina pectoris: Secondary | ICD-10-CM | POA: Diagnosis not present

## 2015-07-10 HISTORY — PX: OOPHORECTOMY: SHX86

## 2015-07-11 DIAGNOSIS — J45909 Unspecified asthma, uncomplicated: Secondary | ICD-10-CM | POA: Diagnosis not present

## 2015-07-11 DIAGNOSIS — I2510 Atherosclerotic heart disease of native coronary artery without angina pectoris: Secondary | ICD-10-CM | POA: Diagnosis not present

## 2015-07-11 DIAGNOSIS — E119 Type 2 diabetes mellitus without complications: Secondary | ICD-10-CM | POA: Diagnosis not present

## 2015-07-11 DIAGNOSIS — D271 Benign neoplasm of left ovary: Secondary | ICD-10-CM | POA: Diagnosis not present

## 2015-07-11 DIAGNOSIS — Z79899 Other long term (current) drug therapy: Secondary | ICD-10-CM | POA: Diagnosis not present

## 2015-07-11 DIAGNOSIS — E785 Hyperlipidemia, unspecified: Secondary | ICD-10-CM | POA: Diagnosis not present

## 2015-07-11 DIAGNOSIS — I10 Essential (primary) hypertension: Secondary | ICD-10-CM | POA: Diagnosis not present

## 2015-07-11 DIAGNOSIS — E669 Obesity, unspecified: Secondary | ICD-10-CM | POA: Diagnosis not present

## 2015-07-11 DIAGNOSIS — G4730 Sleep apnea, unspecified: Secondary | ICD-10-CM | POA: Diagnosis not present

## 2015-08-06 DIAGNOSIS — I351 Nonrheumatic aortic (valve) insufficiency: Secondary | ICD-10-CM

## 2015-08-06 DIAGNOSIS — M5416 Radiculopathy, lumbar region: Secondary | ICD-10-CM

## 2015-08-06 DIAGNOSIS — Z79899 Other long term (current) drug therapy: Secondary | ICD-10-CM | POA: Insufficient documentation

## 2015-08-06 DIAGNOSIS — G894 Chronic pain syndrome: Secondary | ICD-10-CM | POA: Insufficient documentation

## 2015-08-06 DIAGNOSIS — E119 Type 2 diabetes mellitus without complications: Secondary | ICD-10-CM | POA: Insufficient documentation

## 2015-08-06 DIAGNOSIS — M545 Low back pain, unspecified: Secondary | ICD-10-CM | POA: Insufficient documentation

## 2015-08-06 DIAGNOSIS — J45909 Unspecified asthma, uncomplicated: Secondary | ICD-10-CM | POA: Insufficient documentation

## 2015-08-06 DIAGNOSIS — H532 Diplopia: Secondary | ICD-10-CM | POA: Insufficient documentation

## 2015-08-06 DIAGNOSIS — G43009 Migraine without aura, not intractable, without status migrainosus: Secondary | ICD-10-CM | POA: Insufficient documentation

## 2015-08-06 DIAGNOSIS — M25569 Pain in unspecified knee: Secondary | ICD-10-CM | POA: Insufficient documentation

## 2015-08-06 DIAGNOSIS — E1142 Type 2 diabetes mellitus with diabetic polyneuropathy: Secondary | ICD-10-CM | POA: Insufficient documentation

## 2015-08-06 DIAGNOSIS — M5127 Other intervertebral disc displacement, lumbosacral region: Secondary | ICD-10-CM

## 2015-08-06 DIAGNOSIS — E039 Hypothyroidism, unspecified: Secondary | ICD-10-CM

## 2015-08-06 DIAGNOSIS — E785 Hyperlipidemia, unspecified: Secondary | ICD-10-CM | POA: Insufficient documentation

## 2015-08-06 DIAGNOSIS — Z794 Long term (current) use of insulin: Secondary | ICD-10-CM | POA: Insufficient documentation

## 2015-08-06 HISTORY — DX: Hypothyroidism, unspecified: E03.9

## 2015-08-06 HISTORY — DX: Type 2 diabetes mellitus without complications: E11.9

## 2015-08-06 HISTORY — DX: Long term (current) use of insulin: Z79.4

## 2015-08-06 HISTORY — DX: Nonrheumatic aortic (valve) insufficiency: I35.1

## 2015-08-06 HISTORY — DX: Other intervertebral disc displacement, lumbosacral region: M51.27

## 2015-08-06 HISTORY — DX: Radiculopathy, lumbar region: M54.16

## 2015-08-06 HISTORY — DX: Hyperlipidemia, unspecified: E78.5

## 2015-08-06 HISTORY — DX: Type 2 diabetes mellitus with diabetic polyneuropathy: E11.42

## 2015-08-28 DIAGNOSIS — D320 Benign neoplasm of cerebral meninges: Secondary | ICD-10-CM | POA: Diagnosis not present

## 2015-08-28 DIAGNOSIS — G939 Disorder of brain, unspecified: Secondary | ICD-10-CM | POA: Diagnosis not present

## 2015-08-28 DIAGNOSIS — D329 Benign neoplasm of meninges, unspecified: Secondary | ICD-10-CM | POA: Diagnosis not present

## 2015-08-28 DIAGNOSIS — G500 Trigeminal neuralgia: Secondary | ICD-10-CM | POA: Diagnosis not present

## 2015-09-03 DIAGNOSIS — R05 Cough: Secondary | ICD-10-CM | POA: Diagnosis not present

## 2015-09-03 DIAGNOSIS — J189 Pneumonia, unspecified organism: Secondary | ICD-10-CM | POA: Diagnosis not present

## 2015-09-10 DIAGNOSIS — M109 Gout, unspecified: Secondary | ICD-10-CM | POA: Diagnosis not present

## 2015-09-10 DIAGNOSIS — Z09 Encounter for follow-up examination after completed treatment for conditions other than malignant neoplasm: Secondary | ICD-10-CM | POA: Diagnosis not present

## 2015-09-10 DIAGNOSIS — Z6841 Body Mass Index (BMI) 40.0 and over, adult: Secondary | ICD-10-CM | POA: Diagnosis not present

## 2015-10-22 DIAGNOSIS — Z9181 History of falling: Secondary | ICD-10-CM | POA: Diagnosis not present

## 2015-10-22 DIAGNOSIS — E119 Type 2 diabetes mellitus without complications: Secondary | ICD-10-CM | POA: Diagnosis not present

## 2015-10-22 DIAGNOSIS — E782 Mixed hyperlipidemia: Secondary | ICD-10-CM | POA: Diagnosis not present

## 2015-10-22 DIAGNOSIS — I10 Essential (primary) hypertension: Secondary | ICD-10-CM | POA: Diagnosis not present

## 2015-10-22 DIAGNOSIS — E039 Hypothyroidism, unspecified: Secondary | ICD-10-CM | POA: Diagnosis not present

## 2015-10-22 DIAGNOSIS — Z0000 Encounter for general adult medical examination without abnormal findings: Secondary | ICD-10-CM | POA: Diagnosis not present

## 2015-10-22 DIAGNOSIS — J449 Chronic obstructive pulmonary disease, unspecified: Secondary | ICD-10-CM | POA: Diagnosis not present

## 2015-10-22 DIAGNOSIS — Z79899 Other long term (current) drug therapy: Secondary | ICD-10-CM | POA: Diagnosis not present

## 2015-10-22 DIAGNOSIS — E559 Vitamin D deficiency, unspecified: Secondary | ICD-10-CM | POA: Diagnosis not present

## 2015-10-22 DIAGNOSIS — Z1389 Encounter for screening for other disorder: Secondary | ICD-10-CM | POA: Diagnosis not present

## 2016-01-13 DIAGNOSIS — E119 Type 2 diabetes mellitus without complications: Secondary | ICD-10-CM | POA: Diagnosis not present

## 2016-01-20 DIAGNOSIS — M1990 Unspecified osteoarthritis, unspecified site: Secondary | ICD-10-CM | POA: Diagnosis not present

## 2016-01-20 DIAGNOSIS — M19071 Primary osteoarthritis, right ankle and foot: Secondary | ICD-10-CM | POA: Diagnosis not present

## 2016-01-20 DIAGNOSIS — M25571 Pain in right ankle and joints of right foot: Secondary | ICD-10-CM | POA: Diagnosis not present

## 2016-03-10 DIAGNOSIS — M79605 Pain in left leg: Secondary | ICD-10-CM | POA: Diagnosis not present

## 2016-03-10 DIAGNOSIS — M5442 Lumbago with sciatica, left side: Secondary | ICD-10-CM | POA: Diagnosis not present

## 2016-03-10 DIAGNOSIS — R10817 Generalized abdominal tenderness: Secondary | ICD-10-CM | POA: Diagnosis not present

## 2016-03-10 DIAGNOSIS — E669 Obesity, unspecified: Secondary | ICD-10-CM | POA: Diagnosis not present

## 2016-03-10 DIAGNOSIS — I10 Essential (primary) hypertension: Secondary | ICD-10-CM | POA: Diagnosis not present

## 2016-03-10 DIAGNOSIS — I2510 Atherosclerotic heart disease of native coronary artery without angina pectoris: Secondary | ICD-10-CM | POA: Diagnosis not present

## 2016-03-10 DIAGNOSIS — G894 Chronic pain syndrome: Secondary | ICD-10-CM | POA: Diagnosis not present

## 2016-03-10 DIAGNOSIS — M1990 Unspecified osteoarthritis, unspecified site: Secondary | ICD-10-CM | POA: Diagnosis not present

## 2016-03-10 DIAGNOSIS — E119 Type 2 diabetes mellitus without complications: Secondary | ICD-10-CM | POA: Diagnosis not present

## 2016-03-10 DIAGNOSIS — J45909 Unspecified asthma, uncomplicated: Secondary | ICD-10-CM | POA: Diagnosis not present

## 2016-03-10 DIAGNOSIS — R1032 Left lower quadrant pain: Secondary | ICD-10-CM | POA: Diagnosis not present

## 2016-03-15 DIAGNOSIS — J449 Chronic obstructive pulmonary disease, unspecified: Secondary | ICD-10-CM | POA: Diagnosis not present

## 2016-03-15 DIAGNOSIS — E782 Mixed hyperlipidemia: Secondary | ICD-10-CM | POA: Diagnosis not present

## 2016-03-15 DIAGNOSIS — M1990 Unspecified osteoarthritis, unspecified site: Secondary | ICD-10-CM | POA: Diagnosis not present

## 2016-03-15 DIAGNOSIS — I10 Essential (primary) hypertension: Secondary | ICD-10-CM | POA: Diagnosis not present

## 2016-03-15 DIAGNOSIS — E119 Type 2 diabetes mellitus without complications: Secondary | ICD-10-CM | POA: Diagnosis not present

## 2016-03-15 DIAGNOSIS — Z23 Encounter for immunization: Secondary | ICD-10-CM | POA: Diagnosis not present

## 2016-03-15 DIAGNOSIS — E559 Vitamin D deficiency, unspecified: Secondary | ICD-10-CM | POA: Diagnosis not present

## 2016-03-15 DIAGNOSIS — E039 Hypothyroidism, unspecified: Secondary | ICD-10-CM | POA: Diagnosis not present

## 2016-03-15 DIAGNOSIS — Z6841 Body Mass Index (BMI) 40.0 and over, adult: Secondary | ICD-10-CM | POA: Diagnosis not present

## 2016-04-15 DIAGNOSIS — M5441 Lumbago with sciatica, right side: Secondary | ICD-10-CM | POA: Diagnosis not present

## 2016-04-15 DIAGNOSIS — M5416 Radiculopathy, lumbar region: Secondary | ICD-10-CM | POA: Diagnosis not present

## 2016-04-15 DIAGNOSIS — E1142 Type 2 diabetes mellitus with diabetic polyneuropathy: Secondary | ICD-10-CM | POA: Diagnosis not present

## 2016-04-15 DIAGNOSIS — G8929 Other chronic pain: Secondary | ICD-10-CM | POA: Diagnosis not present

## 2016-04-15 DIAGNOSIS — M1A9XX0 Chronic gout, unspecified, without tophus (tophi): Secondary | ICD-10-CM | POA: Diagnosis not present

## 2016-04-15 DIAGNOSIS — M1990 Unspecified osteoarthritis, unspecified site: Secondary | ICD-10-CM | POA: Diagnosis not present

## 2016-04-15 DIAGNOSIS — G894 Chronic pain syndrome: Secondary | ICD-10-CM | POA: Diagnosis not present

## 2016-04-15 DIAGNOSIS — M5127 Other intervertebral disc displacement, lumbosacral region: Secondary | ICD-10-CM | POA: Diagnosis not present

## 2016-04-15 DIAGNOSIS — Z79899 Other long term (current) drug therapy: Secondary | ICD-10-CM | POA: Diagnosis not present

## 2016-05-01 DIAGNOSIS — M5127 Other intervertebral disc displacement, lumbosacral region: Secondary | ICD-10-CM | POA: Diagnosis not present

## 2016-05-06 DIAGNOSIS — M25462 Effusion, left knee: Secondary | ICD-10-CM | POA: Diagnosis not present

## 2016-05-06 DIAGNOSIS — M1712 Unilateral primary osteoarthritis, left knee: Secondary | ICD-10-CM | POA: Diagnosis not present

## 2016-05-06 DIAGNOSIS — M25512 Pain in left shoulder: Secondary | ICD-10-CM | POA: Diagnosis not present

## 2016-05-06 DIAGNOSIS — M19012 Primary osteoarthritis, left shoulder: Secondary | ICD-10-CM | POA: Diagnosis not present

## 2016-05-14 DIAGNOSIS — G8929 Other chronic pain: Secondary | ICD-10-CM | POA: Diagnosis not present

## 2016-05-14 DIAGNOSIS — M1990 Unspecified osteoarthritis, unspecified site: Secondary | ICD-10-CM | POA: Diagnosis not present

## 2016-05-14 DIAGNOSIS — M5416 Radiculopathy, lumbar region: Secondary | ICD-10-CM | POA: Diagnosis not present

## 2016-05-14 DIAGNOSIS — G43009 Migraine without aura, not intractable, without status migrainosus: Secondary | ICD-10-CM | POA: Diagnosis not present

## 2016-05-14 DIAGNOSIS — M5127 Other intervertebral disc displacement, lumbosacral region: Secondary | ICD-10-CM | POA: Diagnosis not present

## 2016-05-14 DIAGNOSIS — G894 Chronic pain syndrome: Secondary | ICD-10-CM | POA: Diagnosis not present

## 2016-05-14 DIAGNOSIS — M1A9XX0 Chronic gout, unspecified, without tophus (tophi): Secondary | ICD-10-CM | POA: Diagnosis not present

## 2016-05-14 DIAGNOSIS — Z79899 Other long term (current) drug therapy: Secondary | ICD-10-CM | POA: Diagnosis not present

## 2016-05-14 DIAGNOSIS — M5441 Lumbago with sciatica, right side: Secondary | ICD-10-CM | POA: Diagnosis not present

## 2016-05-22 DIAGNOSIS — M5416 Radiculopathy, lumbar region: Secondary | ICD-10-CM | POA: Diagnosis not present

## 2016-06-01 DIAGNOSIS — E785 Hyperlipidemia, unspecified: Secondary | ICD-10-CM | POA: Diagnosis not present

## 2016-06-01 DIAGNOSIS — R531 Weakness: Secondary | ICD-10-CM | POA: Diagnosis not present

## 2016-06-01 DIAGNOSIS — E079 Disorder of thyroid, unspecified: Secondary | ICD-10-CM | POA: Diagnosis not present

## 2016-06-01 DIAGNOSIS — M79605 Pain in left leg: Secondary | ICD-10-CM | POA: Diagnosis not present

## 2016-06-01 DIAGNOSIS — E119 Type 2 diabetes mellitus without complications: Secondary | ICD-10-CM | POA: Diagnosis not present

## 2016-06-01 DIAGNOSIS — I10 Essential (primary) hypertension: Secondary | ICD-10-CM | POA: Diagnosis not present

## 2016-06-01 DIAGNOSIS — I2510 Atherosclerotic heart disease of native coronary artery without angina pectoris: Secondary | ICD-10-CM | POA: Diagnosis not present

## 2016-06-01 DIAGNOSIS — M5442 Lumbago with sciatica, left side: Secondary | ICD-10-CM | POA: Diagnosis not present

## 2016-06-01 DIAGNOSIS — G8929 Other chronic pain: Secondary | ICD-10-CM | POA: Diagnosis not present

## 2016-06-23 DIAGNOSIS — G894 Chronic pain syndrome: Secondary | ICD-10-CM | POA: Diagnosis not present

## 2016-06-23 DIAGNOSIS — G8929 Other chronic pain: Secondary | ICD-10-CM | POA: Diagnosis not present

## 2016-06-23 DIAGNOSIS — M5127 Other intervertebral disc displacement, lumbosacral region: Secondary | ICD-10-CM | POA: Diagnosis not present

## 2016-06-23 DIAGNOSIS — M5416 Radiculopathy, lumbar region: Secondary | ICD-10-CM | POA: Diagnosis not present

## 2016-06-23 DIAGNOSIS — M5442 Lumbago with sciatica, left side: Secondary | ICD-10-CM | POA: Diagnosis not present

## 2016-06-23 DIAGNOSIS — E1142 Type 2 diabetes mellitus with diabetic polyneuropathy: Secondary | ICD-10-CM | POA: Diagnosis not present

## 2016-07-02 DIAGNOSIS — M5442 Lumbago with sciatica, left side: Secondary | ICD-10-CM | POA: Diagnosis not present

## 2016-07-14 DIAGNOSIS — J449 Chronic obstructive pulmonary disease, unspecified: Secondary | ICD-10-CM | POA: Diagnosis not present

## 2016-07-14 DIAGNOSIS — Z139 Encounter for screening, unspecified: Secondary | ICD-10-CM | POA: Diagnosis not present

## 2016-07-14 DIAGNOSIS — Z1389 Encounter for screening for other disorder: Secondary | ICD-10-CM | POA: Diagnosis not present

## 2016-07-14 DIAGNOSIS — E559 Vitamin D deficiency, unspecified: Secondary | ICD-10-CM | POA: Diagnosis not present

## 2016-07-14 DIAGNOSIS — M5440 Lumbago with sciatica, unspecified side: Secondary | ICD-10-CM | POA: Diagnosis not present

## 2016-07-14 DIAGNOSIS — E782 Mixed hyperlipidemia: Secondary | ICD-10-CM | POA: Diagnosis not present

## 2016-07-14 DIAGNOSIS — Z79899 Other long term (current) drug therapy: Secondary | ICD-10-CM | POA: Diagnosis not present

## 2016-07-14 DIAGNOSIS — Z1321 Encounter for screening for nutritional disorder: Secondary | ICD-10-CM | POA: Diagnosis not present

## 2016-07-14 DIAGNOSIS — I10 Essential (primary) hypertension: Secondary | ICD-10-CM | POA: Diagnosis not present

## 2016-07-14 DIAGNOSIS — E039 Hypothyroidism, unspecified: Secondary | ICD-10-CM | POA: Diagnosis not present

## 2016-07-14 DIAGNOSIS — E119 Type 2 diabetes mellitus without complications: Secondary | ICD-10-CM | POA: Diagnosis not present

## 2016-07-14 DIAGNOSIS — Z6841 Body Mass Index (BMI) 40.0 and over, adult: Secondary | ICD-10-CM | POA: Diagnosis not present

## 2016-07-26 DIAGNOSIS — M1712 Unilateral primary osteoarthritis, left knee: Secondary | ICD-10-CM | POA: Diagnosis not present

## 2016-08-10 DIAGNOSIS — M1712 Unilateral primary osteoarthritis, left knee: Secondary | ICD-10-CM | POA: Diagnosis not present

## 2016-09-16 DIAGNOSIS — R0602 Shortness of breath: Secondary | ICD-10-CM | POA: Diagnosis not present

## 2016-09-16 DIAGNOSIS — R635 Abnormal weight gain: Secondary | ICD-10-CM | POA: Diagnosis not present

## 2016-09-16 DIAGNOSIS — Z6841 Body Mass Index (BMI) 40.0 and over, adult: Secondary | ICD-10-CM | POA: Diagnosis not present

## 2016-09-16 DIAGNOSIS — R5383 Other fatigue: Secondary | ICD-10-CM | POA: Diagnosis not present

## 2016-09-16 DIAGNOSIS — I10 Essential (primary) hypertension: Secondary | ICD-10-CM | POA: Diagnosis not present

## 2016-09-16 DIAGNOSIS — E119 Type 2 diabetes mellitus without complications: Secondary | ICD-10-CM | POA: Diagnosis not present

## 2016-10-01 DIAGNOSIS — M25561 Pain in right knee: Secondary | ICD-10-CM | POA: Diagnosis not present

## 2016-10-01 DIAGNOSIS — M1711 Unilateral primary osteoarthritis, right knee: Secondary | ICD-10-CM | POA: Diagnosis not present

## 2016-10-01 DIAGNOSIS — M170 Bilateral primary osteoarthritis of knee: Secondary | ICD-10-CM | POA: Diagnosis not present

## 2016-10-08 DIAGNOSIS — Z6841 Body Mass Index (BMI) 40.0 and over, adult: Secondary | ICD-10-CM | POA: Diagnosis not present

## 2016-10-08 DIAGNOSIS — Z1389 Encounter for screening for other disorder: Secondary | ICD-10-CM | POA: Diagnosis not present

## 2016-10-08 DIAGNOSIS — N959 Unspecified menopausal and perimenopausal disorder: Secondary | ICD-10-CM | POA: Diagnosis not present

## 2016-10-08 DIAGNOSIS — Z136 Encounter for screening for cardiovascular disorders: Secondary | ICD-10-CM | POA: Diagnosis not present

## 2016-10-08 DIAGNOSIS — E785 Hyperlipidemia, unspecified: Secondary | ICD-10-CM | POA: Diagnosis not present

## 2016-10-08 DIAGNOSIS — Z1231 Encounter for screening mammogram for malignant neoplasm of breast: Secondary | ICD-10-CM | POA: Diagnosis not present

## 2016-10-08 DIAGNOSIS — Z0000 Encounter for general adult medical examination without abnormal findings: Secondary | ICD-10-CM | POA: Diagnosis not present

## 2016-10-08 DIAGNOSIS — Z9181 History of falling: Secondary | ICD-10-CM | POA: Diagnosis not present

## 2016-11-04 DIAGNOSIS — Z923 Personal history of irradiation: Secondary | ICD-10-CM | POA: Diagnosis not present

## 2016-11-04 DIAGNOSIS — Z483 Aftercare following surgery for neoplasm: Secondary | ICD-10-CM | POA: Diagnosis not present

## 2016-11-04 DIAGNOSIS — G939 Disorder of brain, unspecified: Secondary | ICD-10-CM | POA: Diagnosis not present

## 2016-11-04 DIAGNOSIS — D329 Benign neoplasm of meninges, unspecified: Secondary | ICD-10-CM | POA: Diagnosis not present

## 2016-11-09 DIAGNOSIS — M1711 Unilateral primary osteoarthritis, right knee: Secondary | ICD-10-CM | POA: Diagnosis not present

## 2016-12-01 DIAGNOSIS — R109 Unspecified abdominal pain: Secondary | ICD-10-CM | POA: Diagnosis not present

## 2016-12-01 DIAGNOSIS — R197 Diarrhea, unspecified: Secondary | ICD-10-CM | POA: Diagnosis not present

## 2016-12-01 DIAGNOSIS — I10 Essential (primary) hypertension: Secondary | ICD-10-CM | POA: Diagnosis not present

## 2016-12-01 DIAGNOSIS — I4891 Unspecified atrial fibrillation: Secondary | ICD-10-CM | POA: Diagnosis not present

## 2016-12-01 DIAGNOSIS — R42 Dizziness and giddiness: Secondary | ICD-10-CM | POA: Diagnosis not present

## 2017-05-18 DIAGNOSIS — Z87891 Personal history of nicotine dependence: Secondary | ICD-10-CM | POA: Diagnosis not present

## 2017-05-18 DIAGNOSIS — Z79899 Other long term (current) drug therapy: Secondary | ICD-10-CM | POA: Diagnosis not present

## 2017-05-18 DIAGNOSIS — Z794 Long term (current) use of insulin: Secondary | ICD-10-CM | POA: Diagnosis not present

## 2017-05-18 DIAGNOSIS — J45909 Unspecified asthma, uncomplicated: Secondary | ICD-10-CM | POA: Diagnosis not present

## 2017-05-18 DIAGNOSIS — G8929 Other chronic pain: Secondary | ICD-10-CM | POA: Diagnosis not present

## 2017-05-18 DIAGNOSIS — M79641 Pain in right hand: Secondary | ICD-10-CM | POA: Diagnosis not present

## 2017-05-18 DIAGNOSIS — M25531 Pain in right wrist: Secondary | ICD-10-CM | POA: Diagnosis not present

## 2017-05-18 DIAGNOSIS — M549 Dorsalgia, unspecified: Secondary | ICD-10-CM | POA: Diagnosis not present

## 2017-05-18 DIAGNOSIS — I4891 Unspecified atrial fibrillation: Secondary | ICD-10-CM | POA: Diagnosis not present

## 2017-05-18 DIAGNOSIS — I10 Essential (primary) hypertension: Secondary | ICD-10-CM | POA: Diagnosis not present

## 2017-05-26 DIAGNOSIS — M25531 Pain in right wrist: Secondary | ICD-10-CM | POA: Diagnosis not present

## 2017-05-26 DIAGNOSIS — M65311 Trigger thumb, right thumb: Secondary | ICD-10-CM | POA: Diagnosis not present

## 2017-05-26 DIAGNOSIS — M1811 Unilateral primary osteoarthritis of first carpometacarpal joint, right hand: Secondary | ICD-10-CM | POA: Insufficient documentation

## 2017-06-22 DIAGNOSIS — M1990 Unspecified osteoarthritis, unspecified site: Secondary | ICD-10-CM | POA: Diagnosis not present

## 2017-06-22 DIAGNOSIS — Z6841 Body Mass Index (BMI) 40.0 and over, adult: Secondary | ICD-10-CM | POA: Diagnosis not present

## 2017-06-22 DIAGNOSIS — Z79899 Other long term (current) drug therapy: Secondary | ICD-10-CM | POA: Diagnosis not present

## 2017-06-22 DIAGNOSIS — M159 Polyosteoarthritis, unspecified: Secondary | ICD-10-CM | POA: Diagnosis not present

## 2017-06-22 DIAGNOSIS — K219 Gastro-esophageal reflux disease without esophagitis: Secondary | ICD-10-CM | POA: Diagnosis not present

## 2017-06-22 DIAGNOSIS — E119 Type 2 diabetes mellitus without complications: Secondary | ICD-10-CM | POA: Diagnosis not present

## 2017-06-22 DIAGNOSIS — D329 Benign neoplasm of meninges, unspecified: Secondary | ICD-10-CM | POA: Diagnosis not present

## 2017-06-22 DIAGNOSIS — E782 Mixed hyperlipidemia: Secondary | ICD-10-CM | POA: Diagnosis not present

## 2017-06-22 DIAGNOSIS — M5440 Lumbago with sciatica, unspecified side: Secondary | ICD-10-CM | POA: Diagnosis not present

## 2017-06-22 DIAGNOSIS — I10 Essential (primary) hypertension: Secondary | ICD-10-CM | POA: Diagnosis not present

## 2017-06-22 DIAGNOSIS — I2510 Atherosclerotic heart disease of native coronary artery without angina pectoris: Secondary | ICD-10-CM | POA: Diagnosis not present

## 2017-06-23 DIAGNOSIS — R69 Illness, unspecified: Secondary | ICD-10-CM | POA: Diagnosis not present

## 2017-06-28 DIAGNOSIS — R69 Illness, unspecified: Secondary | ICD-10-CM | POA: Diagnosis not present

## 2017-07-07 DIAGNOSIS — Z6841 Body Mass Index (BMI) 40.0 and over, adult: Secondary | ICD-10-CM | POA: Diagnosis not present

## 2017-07-07 DIAGNOSIS — I10 Essential (primary) hypertension: Secondary | ICD-10-CM | POA: Diagnosis not present

## 2017-07-07 DIAGNOSIS — J309 Allergic rhinitis, unspecified: Secondary | ICD-10-CM | POA: Diagnosis not present

## 2017-07-07 DIAGNOSIS — J45909 Unspecified asthma, uncomplicated: Secondary | ICD-10-CM | POA: Diagnosis not present

## 2017-07-07 DIAGNOSIS — E119 Type 2 diabetes mellitus without complications: Secondary | ICD-10-CM | POA: Diagnosis not present

## 2017-07-11 DIAGNOSIS — I4891 Unspecified atrial fibrillation: Secondary | ICD-10-CM | POA: Diagnosis not present

## 2017-07-11 DIAGNOSIS — Z87891 Personal history of nicotine dependence: Secondary | ICD-10-CM | POA: Diagnosis not present

## 2017-07-11 DIAGNOSIS — M1990 Unspecified osteoarthritis, unspecified site: Secondary | ICD-10-CM | POA: Diagnosis not present

## 2017-07-11 DIAGNOSIS — E7800 Pure hypercholesterolemia, unspecified: Secondary | ICD-10-CM | POA: Diagnosis not present

## 2017-07-11 DIAGNOSIS — K219 Gastro-esophageal reflux disease without esophagitis: Secondary | ICD-10-CM | POA: Diagnosis not present

## 2017-07-11 DIAGNOSIS — I119 Hypertensive heart disease without heart failure: Secondary | ICD-10-CM | POA: Diagnosis not present

## 2017-07-14 DIAGNOSIS — M1811 Unilateral primary osteoarthritis of first carpometacarpal joint, right hand: Secondary | ICD-10-CM | POA: Diagnosis not present

## 2017-07-14 DIAGNOSIS — M25521 Pain in right elbow: Secondary | ICD-10-CM | POA: Diagnosis not present

## 2017-07-14 DIAGNOSIS — M25531 Pain in right wrist: Secondary | ICD-10-CM | POA: Diagnosis not present

## 2017-07-14 DIAGNOSIS — R69 Illness, unspecified: Secondary | ICD-10-CM | POA: Diagnosis not present

## 2017-07-26 ENCOUNTER — Encounter: Payer: Self-pay | Admitting: Gastroenterology

## 2017-08-05 DIAGNOSIS — M1711 Unilateral primary osteoarthritis, right knee: Secondary | ICD-10-CM | POA: Diagnosis not present

## 2017-08-16 DIAGNOSIS — G5621 Lesion of ulnar nerve, right upper limb: Secondary | ICD-10-CM | POA: Diagnosis not present

## 2017-08-16 DIAGNOSIS — G5601 Carpal tunnel syndrome, right upper limb: Secondary | ICD-10-CM | POA: Diagnosis not present

## 2017-08-21 DIAGNOSIS — M19031 Primary osteoarthritis, right wrist: Secondary | ICD-10-CM | POA: Diagnosis not present

## 2017-08-21 DIAGNOSIS — I10 Essential (primary) hypertension: Secondary | ICD-10-CM | POA: Diagnosis not present

## 2017-08-21 DIAGNOSIS — M25412 Effusion, left shoulder: Secondary | ICD-10-CM | POA: Diagnosis not present

## 2017-08-21 DIAGNOSIS — M109 Gout, unspecified: Secondary | ICD-10-CM | POA: Diagnosis not present

## 2017-08-21 DIAGNOSIS — M19032 Primary osteoarthritis, left wrist: Secondary | ICD-10-CM | POA: Diagnosis not present

## 2017-08-21 DIAGNOSIS — R0789 Other chest pain: Secondary | ICD-10-CM | POA: Diagnosis not present

## 2017-08-21 DIAGNOSIS — E876 Hypokalemia: Secondary | ICD-10-CM | POA: Diagnosis not present

## 2017-08-21 DIAGNOSIS — E109 Type 1 diabetes mellitus without complications: Secondary | ICD-10-CM | POA: Diagnosis not present

## 2017-08-21 DIAGNOSIS — Z794 Long term (current) use of insulin: Secondary | ICD-10-CM | POA: Diagnosis not present

## 2017-08-21 DIAGNOSIS — R079 Chest pain, unspecified: Secondary | ICD-10-CM | POA: Diagnosis not present

## 2017-08-21 DIAGNOSIS — B9689 Other specified bacterial agents as the cause of diseases classified elsewhere: Secondary | ICD-10-CM | POA: Diagnosis not present

## 2017-08-21 DIAGNOSIS — M25512 Pain in left shoulder: Secondary | ICD-10-CM | POA: Diagnosis not present

## 2017-08-21 DIAGNOSIS — R7881 Bacteremia: Secondary | ICD-10-CM | POA: Diagnosis not present

## 2017-08-21 DIAGNOSIS — M10022 Idiopathic gout, left elbow: Secondary | ICD-10-CM | POA: Diagnosis not present

## 2017-08-22 DIAGNOSIS — M25512 Pain in left shoulder: Secondary | ICD-10-CM | POA: Diagnosis not present

## 2017-08-22 DIAGNOSIS — E669 Obesity, unspecified: Secondary | ICD-10-CM | POA: Diagnosis not present

## 2017-08-22 DIAGNOSIS — M75112 Incomplete rotator cuff tear or rupture of left shoulder, not specified as traumatic: Secondary | ICD-10-CM | POA: Diagnosis not present

## 2017-08-22 DIAGNOSIS — B9689 Other specified bacterial agents as the cause of diseases classified elsewhere: Secondary | ICD-10-CM | POA: Diagnosis not present

## 2017-08-22 DIAGNOSIS — M10022 Idiopathic gout, left elbow: Secondary | ICD-10-CM | POA: Diagnosis not present

## 2017-08-22 DIAGNOSIS — M1000 Idiopathic gout, unspecified site: Secondary | ICD-10-CM | POA: Diagnosis not present

## 2017-08-22 DIAGNOSIS — M10032 Idiopathic gout, left wrist: Secondary | ICD-10-CM | POA: Diagnosis not present

## 2017-08-22 DIAGNOSIS — E1142 Type 2 diabetes mellitus with diabetic polyneuropathy: Secondary | ICD-10-CM | POA: Diagnosis not present

## 2017-08-22 DIAGNOSIS — Z7982 Long term (current) use of aspirin: Secondary | ICD-10-CM | POA: Diagnosis not present

## 2017-08-22 DIAGNOSIS — Z6841 Body Mass Index (BMI) 40.0 and over, adult: Secondary | ICD-10-CM | POA: Diagnosis not present

## 2017-08-22 DIAGNOSIS — I10 Essential (primary) hypertension: Secondary | ICD-10-CM | POA: Diagnosis not present

## 2017-08-22 DIAGNOSIS — R079 Chest pain, unspecified: Secondary | ICD-10-CM | POA: Diagnosis not present

## 2017-08-22 DIAGNOSIS — E039 Hypothyroidism, unspecified: Secondary | ICD-10-CM | POA: Diagnosis not present

## 2017-08-22 DIAGNOSIS — R7881 Bacteremia: Secondary | ICD-10-CM | POA: Diagnosis not present

## 2017-08-22 DIAGNOSIS — M25522 Pain in left elbow: Secondary | ICD-10-CM | POA: Diagnosis not present

## 2017-08-22 DIAGNOSIS — M75100 Unspecified rotator cuff tear or rupture of unspecified shoulder, not specified as traumatic: Secondary | ICD-10-CM | POA: Diagnosis not present

## 2017-08-22 DIAGNOSIS — Z452 Encounter for adjustment and management of vascular access device: Secondary | ICD-10-CM | POA: Diagnosis not present

## 2017-08-22 DIAGNOSIS — E119 Type 2 diabetes mellitus without complications: Secondary | ICD-10-CM | POA: Diagnosis not present

## 2017-08-22 DIAGNOSIS — M65132 Other infective (teno)synovitis, left wrist: Secondary | ICD-10-CM | POA: Diagnosis not present

## 2017-08-22 DIAGNOSIS — M659 Synovitis and tenosynovitis, unspecified: Secondary | ICD-10-CM | POA: Diagnosis not present

## 2017-08-22 DIAGNOSIS — E785 Hyperlipidemia, unspecified: Secondary | ICD-10-CM | POA: Diagnosis not present

## 2017-08-22 DIAGNOSIS — M25511 Pain in right shoulder: Secondary | ICD-10-CM | POA: Diagnosis not present

## 2017-08-22 DIAGNOSIS — N179 Acute kidney failure, unspecified: Secondary | ICD-10-CM | POA: Diagnosis not present

## 2017-08-22 DIAGNOSIS — E109 Type 1 diabetes mellitus without complications: Secondary | ICD-10-CM | POA: Diagnosis not present

## 2017-08-22 DIAGNOSIS — E876 Hypokalemia: Secondary | ICD-10-CM | POA: Diagnosis not present

## 2017-08-22 DIAGNOSIS — M75102 Unspecified rotator cuff tear or rupture of left shoulder, not specified as traumatic: Secondary | ICD-10-CM | POA: Diagnosis not present

## 2017-08-22 DIAGNOSIS — M109 Gout, unspecified: Secondary | ICD-10-CM | POA: Diagnosis not present

## 2017-08-22 DIAGNOSIS — R0789 Other chest pain: Secondary | ICD-10-CM | POA: Diagnosis not present

## 2017-08-22 DIAGNOSIS — M25532 Pain in left wrist: Secondary | ICD-10-CM | POA: Diagnosis not present

## 2017-08-22 DIAGNOSIS — M25412 Effusion, left shoulder: Secondary | ICD-10-CM | POA: Diagnosis not present

## 2017-08-22 DIAGNOSIS — M19031 Primary osteoarthritis, right wrist: Secondary | ICD-10-CM | POA: Diagnosis not present

## 2017-08-22 DIAGNOSIS — Z794 Long term (current) use of insulin: Secondary | ICD-10-CM | POA: Diagnosis not present

## 2017-08-22 DIAGNOSIS — M7989 Other specified soft tissue disorders: Secondary | ICD-10-CM | POA: Diagnosis not present

## 2017-08-22 DIAGNOSIS — B954 Other streptococcus as the cause of diseases classified elsewhere: Secondary | ICD-10-CM | POA: Diagnosis not present

## 2017-08-22 DIAGNOSIS — R509 Fever, unspecified: Secondary | ICD-10-CM | POA: Diagnosis not present

## 2017-08-22 DIAGNOSIS — M19032 Primary osteoarthritis, left wrist: Secondary | ICD-10-CM | POA: Diagnosis not present

## 2017-08-23 DIAGNOSIS — E119 Type 2 diabetes mellitus without complications: Secondary | ICD-10-CM | POA: Diagnosis not present

## 2017-08-23 DIAGNOSIS — R0789 Other chest pain: Secondary | ICD-10-CM | POA: Diagnosis not present

## 2017-08-23 DIAGNOSIS — Z794 Long term (current) use of insulin: Secondary | ICD-10-CM | POA: Diagnosis not present

## 2017-08-23 DIAGNOSIS — I10 Essential (primary) hypertension: Secondary | ICD-10-CM | POA: Diagnosis not present

## 2017-08-23 DIAGNOSIS — M10022 Idiopathic gout, left elbow: Secondary | ICD-10-CM | POA: Diagnosis not present

## 2017-08-29 DIAGNOSIS — S46812A Strain of other muscles, fascia and tendons at shoulder and upper arm level, left arm, initial encounter: Secondary | ICD-10-CM | POA: Insufficient documentation

## 2017-08-29 DIAGNOSIS — M75102 Unspecified rotator cuff tear or rupture of left shoulder, not specified as traumatic: Secondary | ICD-10-CM | POA: Insufficient documentation

## 2017-08-29 DIAGNOSIS — M659 Synovitis and tenosynovitis, unspecified: Secondary | ICD-10-CM | POA: Insufficient documentation

## 2017-08-30 DIAGNOSIS — R7881 Bacteremia: Secondary | ICD-10-CM | POA: Diagnosis not present

## 2017-08-31 ENCOUNTER — Ambulatory Visit: Payer: Medicare Other | Admitting: Cardiology

## 2017-08-31 DIAGNOSIS — R7881 Bacteremia: Secondary | ICD-10-CM | POA: Diagnosis not present

## 2017-09-01 DIAGNOSIS — R7881 Bacteremia: Secondary | ICD-10-CM | POA: Diagnosis not present

## 2017-09-02 DIAGNOSIS — R7881 Bacteremia: Secondary | ICD-10-CM | POA: Diagnosis not present

## 2017-09-03 DIAGNOSIS — R7881 Bacteremia: Secondary | ICD-10-CM | POA: Diagnosis not present

## 2017-09-04 DIAGNOSIS — R7881 Bacteremia: Secondary | ICD-10-CM | POA: Diagnosis not present

## 2017-09-05 DIAGNOSIS — R7881 Bacteremia: Secondary | ICD-10-CM | POA: Diagnosis not present

## 2017-09-06 DIAGNOSIS — R7881 Bacteremia: Secondary | ICD-10-CM | POA: Diagnosis not present

## 2017-09-06 DIAGNOSIS — M659 Synovitis and tenosynovitis, unspecified: Secondary | ICD-10-CM | POA: Diagnosis not present

## 2017-09-06 DIAGNOSIS — A491 Streptococcal infection, unspecified site: Secondary | ICD-10-CM | POA: Diagnosis not present

## 2017-09-06 DIAGNOSIS — Z792 Long term (current) use of antibiotics: Secondary | ICD-10-CM | POA: Diagnosis not present

## 2017-09-07 DIAGNOSIS — R7881 Bacteremia: Secondary | ICD-10-CM | POA: Diagnosis not present

## 2017-09-08 DIAGNOSIS — R69 Illness, unspecified: Secondary | ICD-10-CM | POA: Diagnosis not present

## 2017-09-08 DIAGNOSIS — R7881 Bacteremia: Secondary | ICD-10-CM | POA: Diagnosis not present

## 2017-09-10 DIAGNOSIS — R7881 Bacteremia: Secondary | ICD-10-CM | POA: Diagnosis not present

## 2017-09-11 DIAGNOSIS — R1030 Lower abdominal pain, unspecified: Secondary | ICD-10-CM | POA: Diagnosis not present

## 2017-09-11 DIAGNOSIS — R109 Unspecified abdominal pain: Secondary | ICD-10-CM | POA: Diagnosis not present

## 2017-09-11 DIAGNOSIS — R1031 Right lower quadrant pain: Secondary | ICD-10-CM | POA: Diagnosis not present

## 2017-09-15 DIAGNOSIS — R7881 Bacteremia: Secondary | ICD-10-CM | POA: Diagnosis not present

## 2017-09-15 DIAGNOSIS — Z79899 Other long term (current) drug therapy: Secondary | ICD-10-CM | POA: Diagnosis not present

## 2017-09-15 DIAGNOSIS — M1990 Unspecified osteoarthritis, unspecified site: Secondary | ICD-10-CM | POA: Diagnosis not present

## 2017-09-15 DIAGNOSIS — M1A9XX0 Chronic gout, unspecified, without tophus (tophi): Secondary | ICD-10-CM | POA: Diagnosis not present

## 2017-09-16 DIAGNOSIS — R7881 Bacteremia: Secondary | ICD-10-CM | POA: Diagnosis not present

## 2017-09-17 DIAGNOSIS — R7881 Bacteremia: Secondary | ICD-10-CM | POA: Diagnosis not present

## 2017-09-18 DIAGNOSIS — R7881 Bacteremia: Secondary | ICD-10-CM | POA: Diagnosis not present

## 2017-09-19 DIAGNOSIS — R7881 Bacteremia: Secondary | ICD-10-CM | POA: Diagnosis not present

## 2017-09-20 DIAGNOSIS — R7881 Bacteremia: Secondary | ICD-10-CM | POA: Diagnosis not present

## 2017-09-21 DIAGNOSIS — E1142 Type 2 diabetes mellitus with diabetic polyneuropathy: Secondary | ICD-10-CM | POA: Diagnosis not present

## 2017-09-21 DIAGNOSIS — R7881 Bacteremia: Secondary | ICD-10-CM | POA: Diagnosis not present

## 2017-09-21 DIAGNOSIS — Z794 Long term (current) use of insulin: Secondary | ICD-10-CM | POA: Diagnosis not present

## 2017-10-04 DIAGNOSIS — Z09 Encounter for follow-up examination after completed treatment for conditions other than malignant neoplasm: Secondary | ICD-10-CM | POA: Diagnosis not present

## 2017-10-04 DIAGNOSIS — E039 Hypothyroidism, unspecified: Secondary | ICD-10-CM | POA: Diagnosis not present

## 2017-10-04 DIAGNOSIS — I10 Essential (primary) hypertension: Secondary | ICD-10-CM | POA: Diagnosis not present

## 2017-10-04 DIAGNOSIS — Z79899 Other long term (current) drug therapy: Secondary | ICD-10-CM | POA: Diagnosis not present

## 2017-10-04 DIAGNOSIS — Z6841 Body Mass Index (BMI) 40.0 and over, adult: Secondary | ICD-10-CM | POA: Diagnosis not present

## 2017-10-04 DIAGNOSIS — R1031 Right lower quadrant pain: Secondary | ICD-10-CM | POA: Diagnosis not present

## 2017-10-04 DIAGNOSIS — E782 Mixed hyperlipidemia: Secondary | ICD-10-CM | POA: Diagnosis not present

## 2017-10-04 DIAGNOSIS — E559 Vitamin D deficiency, unspecified: Secondary | ICD-10-CM | POA: Diagnosis not present

## 2017-10-04 DIAGNOSIS — E119 Type 2 diabetes mellitus without complications: Secondary | ICD-10-CM | POA: Diagnosis not present

## 2017-10-07 ENCOUNTER — Ambulatory Visit: Payer: Medicare Other | Admitting: Gastroenterology

## 2017-10-08 DIAGNOSIS — R69 Illness, unspecified: Secondary | ICD-10-CM | POA: Diagnosis not present

## 2017-10-10 ENCOUNTER — Ambulatory Visit: Payer: Medicare Other | Admitting: Cardiology

## 2017-10-18 ENCOUNTER — Ambulatory Visit: Payer: Medicare Other | Admitting: Gastroenterology

## 2017-11-07 DIAGNOSIS — K219 Gastro-esophageal reflux disease without esophagitis: Secondary | ICD-10-CM | POA: Diagnosis not present

## 2017-11-07 DIAGNOSIS — G8929 Other chronic pain: Secondary | ICD-10-CM | POA: Diagnosis not present

## 2017-11-07 DIAGNOSIS — Z7982 Long term (current) use of aspirin: Secondary | ICD-10-CM | POA: Diagnosis not present

## 2017-11-07 DIAGNOSIS — Z794 Long term (current) use of insulin: Secondary | ICD-10-CM | POA: Diagnosis not present

## 2017-11-07 DIAGNOSIS — J309 Allergic rhinitis, unspecified: Secondary | ICD-10-CM | POA: Diagnosis not present

## 2017-11-07 DIAGNOSIS — K08409 Partial loss of teeth, unspecified cause, unspecified class: Secondary | ICD-10-CM | POA: Diagnosis not present

## 2017-11-07 DIAGNOSIS — M109 Gout, unspecified: Secondary | ICD-10-CM | POA: Diagnosis not present

## 2017-11-07 DIAGNOSIS — I10 Essential (primary) hypertension: Secondary | ICD-10-CM | POA: Diagnosis not present

## 2017-11-07 DIAGNOSIS — E119 Type 2 diabetes mellitus without complications: Secondary | ICD-10-CM | POA: Diagnosis not present

## 2017-11-11 ENCOUNTER — Ambulatory Visit: Payer: Medicare Other | Admitting: Cardiology

## 2017-11-16 DIAGNOSIS — E669 Obesity, unspecified: Secondary | ICD-10-CM | POA: Diagnosis not present

## 2017-11-16 DIAGNOSIS — Z1339 Encounter for screening examination for other mental health and behavioral disorders: Secondary | ICD-10-CM | POA: Diagnosis not present

## 2017-11-16 DIAGNOSIS — Z1231 Encounter for screening mammogram for malignant neoplasm of breast: Secondary | ICD-10-CM | POA: Diagnosis not present

## 2017-11-16 DIAGNOSIS — Z1211 Encounter for screening for malignant neoplasm of colon: Secondary | ICD-10-CM | POA: Diagnosis not present

## 2017-11-16 DIAGNOSIS — Z0000 Encounter for general adult medical examination without abnormal findings: Secondary | ICD-10-CM | POA: Diagnosis not present

## 2017-11-16 DIAGNOSIS — Z1331 Encounter for screening for depression: Secondary | ICD-10-CM | POA: Diagnosis not present

## 2017-11-16 DIAGNOSIS — N959 Unspecified menopausal and perimenopausal disorder: Secondary | ICD-10-CM | POA: Diagnosis not present

## 2017-11-16 DIAGNOSIS — Z139 Encounter for screening, unspecified: Secondary | ICD-10-CM | POA: Diagnosis not present

## 2017-11-16 DIAGNOSIS — E785 Hyperlipidemia, unspecified: Secondary | ICD-10-CM | POA: Diagnosis not present

## 2017-11-16 DIAGNOSIS — Z6841 Body Mass Index (BMI) 40.0 and over, adult: Secondary | ICD-10-CM | POA: Diagnosis not present

## 2017-11-22 DIAGNOSIS — E782 Mixed hyperlipidemia: Secondary | ICD-10-CM | POA: Diagnosis not present

## 2017-11-22 DIAGNOSIS — I10 Essential (primary) hypertension: Secondary | ICD-10-CM | POA: Diagnosis not present

## 2017-11-22 DIAGNOSIS — E1142 Type 2 diabetes mellitus with diabetic polyneuropathy: Secondary | ICD-10-CM | POA: Diagnosis not present

## 2017-11-22 DIAGNOSIS — Z794 Long term (current) use of insulin: Secondary | ICD-10-CM | POA: Diagnosis not present

## 2017-11-25 ENCOUNTER — Ambulatory Visit: Payer: Medicare Other | Admitting: Gastroenterology

## 2017-11-29 DIAGNOSIS — R69 Illness, unspecified: Secondary | ICD-10-CM | POA: Diagnosis not present

## 2017-12-12 DIAGNOSIS — Z6841 Body Mass Index (BMI) 40.0 and over, adult: Secondary | ICD-10-CM | POA: Diagnosis not present

## 2017-12-12 DIAGNOSIS — Z1331 Encounter for screening for depression: Secondary | ICD-10-CM | POA: Diagnosis not present

## 2017-12-12 DIAGNOSIS — R635 Abnormal weight gain: Secondary | ICD-10-CM | POA: Diagnosis not present

## 2017-12-12 DIAGNOSIS — Z9181 History of falling: Secondary | ICD-10-CM | POA: Diagnosis not present

## 2017-12-29 DIAGNOSIS — R69 Illness, unspecified: Secondary | ICD-10-CM | POA: Diagnosis not present

## 2018-01-02 ENCOUNTER — Encounter: Payer: Self-pay | Admitting: Gastroenterology

## 2018-01-02 DIAGNOSIS — Z79899 Other long term (current) drug therapy: Secondary | ICD-10-CM | POA: Diagnosis not present

## 2018-01-02 DIAGNOSIS — K219 Gastro-esophageal reflux disease without esophagitis: Secondary | ICD-10-CM | POA: Diagnosis not present

## 2018-01-02 DIAGNOSIS — M25562 Pain in left knee: Secondary | ICD-10-CM | POA: Diagnosis not present

## 2018-01-02 DIAGNOSIS — E039 Hypothyroidism, unspecified: Secondary | ICD-10-CM | POA: Diagnosis not present

## 2018-01-02 DIAGNOSIS — Z794 Long term (current) use of insulin: Secondary | ICD-10-CM | POA: Diagnosis not present

## 2018-01-02 DIAGNOSIS — E119 Type 2 diabetes mellitus without complications: Secondary | ICD-10-CM | POA: Diagnosis not present

## 2018-01-02 DIAGNOSIS — I160 Hypertensive urgency: Secondary | ICD-10-CM | POA: Diagnosis not present

## 2018-01-02 DIAGNOSIS — G8929 Other chronic pain: Secondary | ICD-10-CM | POA: Diagnosis not present

## 2018-01-02 DIAGNOSIS — I10 Essential (primary) hypertension: Secondary | ICD-10-CM | POA: Diagnosis not present

## 2018-01-02 DIAGNOSIS — M1990 Unspecified osteoarthritis, unspecified site: Secondary | ICD-10-CM | POA: Diagnosis not present

## 2018-01-08 ENCOUNTER — Emergency Department (HOSPITAL_COMMUNITY): Payer: Medicare HMO

## 2018-01-08 ENCOUNTER — Encounter (HOSPITAL_COMMUNITY): Payer: Self-pay | Admitting: Emergency Medicine

## 2018-01-08 ENCOUNTER — Emergency Department (HOSPITAL_COMMUNITY)
Admission: EM | Admit: 2018-01-08 | Discharge: 2018-01-08 | Disposition: A | Discharge status: Home / Self Care | Payer: Medicare HMO | Attending: Emergency Medicine | Admitting: Emergency Medicine

## 2018-01-08 DIAGNOSIS — E039 Hypothyroidism, unspecified: Secondary | ICD-10-CM | POA: Insufficient documentation

## 2018-01-08 DIAGNOSIS — E119 Type 2 diabetes mellitus without complications: Secondary | ICD-10-CM | POA: Diagnosis not present

## 2018-01-08 DIAGNOSIS — J45909 Unspecified asthma, uncomplicated: Secondary | ICD-10-CM | POA: Insufficient documentation

## 2018-01-08 DIAGNOSIS — M25462 Effusion, left knee: Secondary | ICD-10-CM | POA: Diagnosis not present

## 2018-01-08 DIAGNOSIS — Z79899 Other long term (current) drug therapy: Secondary | ICD-10-CM | POA: Diagnosis not present

## 2018-01-08 DIAGNOSIS — F17210 Nicotine dependence, cigarettes, uncomplicated: Secondary | ICD-10-CM | POA: Diagnosis not present

## 2018-01-08 DIAGNOSIS — Z794 Long term (current) use of insulin: Secondary | ICD-10-CM | POA: Diagnosis not present

## 2018-01-08 DIAGNOSIS — I10 Essential (primary) hypertension: Secondary | ICD-10-CM | POA: Insufficient documentation

## 2018-01-08 DIAGNOSIS — M25562 Pain in left knee: Secondary | ICD-10-CM | POA: Diagnosis not present

## 2018-01-08 DIAGNOSIS — R69 Illness, unspecified: Secondary | ICD-10-CM | POA: Diagnosis not present

## 2018-01-08 LAB — CBC WITH DIFFERENTIAL/PLATELET
ABS IMMATURE GRANULOCYTES: 0.1 10*3/uL (ref 0.0–0.1)
Basophils Absolute: 0 10*3/uL (ref 0.0–0.1)
Basophils Relative: 0 %
EOS PCT: 1 %
Eosinophils Absolute: 0.1 10*3/uL (ref 0.0–0.7)
HCT: 39.1 % (ref 36.0–46.0)
HEMOGLOBIN: 12.6 g/dL (ref 12.0–15.0)
Immature Granulocytes: 0 %
LYMPHS PCT: 15 %
Lymphs Abs: 1.7 10*3/uL (ref 0.7–4.0)
MCH: 30.1 pg (ref 26.0–34.0)
MCHC: 32.2 g/dL (ref 30.0–36.0)
MCV: 93.3 fL (ref 78.0–100.0)
Monocytes Absolute: 1.2 10*3/uL — ABNORMAL HIGH (ref 0.1–1.0)
Monocytes Relative: 11 %
NEUTROS ABS: 8.2 10*3/uL — AB (ref 1.7–7.7)
NEUTROS PCT: 73 %
Platelets: 328 10*3/uL (ref 150–400)
RBC: 4.19 MIL/uL (ref 3.87–5.11)
RDW: 14.2 % (ref 11.5–15.5)
WBC: 11.3 10*3/uL — AB (ref 4.0–10.5)

## 2018-01-08 LAB — BASIC METABOLIC PANEL
ANION GAP: 9 (ref 5–15)
BUN: 9 mg/dL (ref 8–23)
CHLORIDE: 102 mmol/L (ref 98–111)
CO2: 31 mmol/L (ref 22–32)
Calcium: 9.4 mg/dL (ref 8.9–10.3)
Creatinine, Ser: 0.93 mg/dL (ref 0.44–1.00)
Glucose, Bld: 157 mg/dL — ABNORMAL HIGH (ref 70–99)
POTASSIUM: 3.5 mmol/L (ref 3.5–5.1)
SODIUM: 142 mmol/L (ref 135–145)

## 2018-01-08 MED ORDER — OXYCODONE-ACETAMINOPHEN 5-325 MG PO TABS
1.0000 | ORAL_TABLET | Freq: Once | ORAL | Status: AC
  Administered 2018-01-08: 1 via ORAL
  Filled 2018-01-08: qty 1

## 2018-01-08 MED ORDER — OXYCODONE-ACETAMINOPHEN 5-325 MG PO TABS: 1.0000 | ORAL_TABLET | ORAL | 0 refills | Status: DC | PRN

## 2018-01-08 MED ORDER — PREDNISONE 10 MG (21) PO TBPK: ORAL_TABLET | Freq: Every day | ORAL | 0 refills | Status: DC

## 2018-01-08 NOTE — ED Notes (Signed)
Ice given to patient for knee per provider order, patient refused, provider @bedside  and aware

## 2018-01-08 NOTE — Discharge Instructions (Addendum)
Please take a steroid taper as directed for pain and inflammation Take anti-inflammatories (suck as Naproxen or Ibuprofen) twice daily Take Percocet as needed for severe pain Please follow up with your doctor on Thursday

## 2018-01-08 NOTE — ED Triage Notes (Signed)
Pt has had left knee pain for 4 weeks that started at the front of the knee. She now has a palpable lump behind the knee with swelling noted to knee cap area. Pt states she knows she has "bone on bone" to this knee and has had to have fluid removed from it before.

## 2018-01-08 NOTE — ED Notes (Signed)
Patient  C/o left knee pain onset 4 weeks ago , states she as seen at [facility] last Sunday and was told to follow up with her PCP, states she has an appointment with her Ortho doctor Thurs however she can't wait.

## 2018-01-08 NOTE — ED Provider Notes (Signed)
Medical screening examination/treatment/procedure(s) were conducted as a shared visit with non-physician practitioner(s) and myself.  I personally evaluated the patient during the encounter.  67 year old female with 4 weeks of progressively worsening left knee pain and swelling.  States that she has arthritis there and has been to the emergency room and aspirin multiple times for arthrocentesis and steroid injection however she went there recently and they would not do it for her.  She can get to see her orthopedic doctor till Thursday so she is coming here to see if we can do it.  She thinks it might be infected.  She had no fevers, nausea, vomiting or other associated symptoms.  On my exam she has an obvious effusion with no erythema, warmth, tenderness.  If you range of motion her past 15 or 20 degrees it does become painful but I think this is more likely from the amount of fluid in her knee rather than infection.  White count is mildly elevated but she is on steroids and is actually lower than it normally is.  She does not have a fever.  Rest her vital signs are within normal limits.  I have low suspicion for septic arthritis at this point.  I explained her that we do not do therapeutic arthrocentesis but we would give her medications to see if it made her symptoms better and have her follow-up with orthopedics.   Merrily Pew, MD 01/08/18 2236

## 2018-01-08 NOTE — ED Notes (Signed)
Pt returned to department from xray

## 2018-01-08 NOTE — ED Provider Notes (Signed)
[place] EMERGENCY DEPARTMENT Provider Note   CSN: [id number] Arrival date & time: 01/08/18  1157     History   Chief Complaint Chief Complaint  Patient presents with  . Knee Pain    HPI Michelle Harding is a 67 y.o. female who presents with left knee pain. PMH significant for gout, chronic knee pain, arthritis, insulin dependent diabetes. She states that she has issues with chronic pain in the left knee. It's been hurting worse over the past 4 weeks. She is unsure if she's had gout in the knee before but knows she has arthritis. She also reports a "pop" in her knee several weeks ago and has been having swelling ever since then. She states that she has had a low grade temp and feels "sick". She went to [facility] last week and states that usually she is given pain medicine, has a therapeutic tap, and is given an intraarticular injection however she saw someone different there then she usually sees and they wouldn't do it. She came here today because she is in so much pain. She has an appointment with her PCP next Thursday but is in too much pain to wait. Of note, she was admitted in April 2019 for left shoulder and left wrist pain/swelling which was thought to be due to gout. She was febrile at that time, had 1/2 + blood cultures for strep mitis and had an extensive work up (CT chest, MRI of the shoulder and wrist, TEE) which did not show a revealing source. She has follow up with her doctor on Thursday.   HPI  Past Medical History:  Diagnosis Date  . Asthma   . Diabetes mellitus   . Heart disease   . Hemorrhoids   . Hypertension   . IBS (irritable bowel syndrome)   . Ovarian cyst     Patient Active Problem List   Diagnosis Date Noted  . Asthma 08/06/2015  . Aortic regurgitation 08/06/2015  . Diabetes, polyneuropathy ([place]) 08/06/2015  . Hyperlipidemia 08/06/2015  . Hypothyroidism 08/06/2015  . Coronary artery disease involving native coronary artery of  native heart without angina pectoris 04/24/2015  . Dyslipidemia 04/24/2015  . Bradycardia by electrocardiogram 02/10/2012  . Community acquired pneumonia 02/07/2012  . Hypertension 02/07/2012  . Diabetes mellitus ([place]) 02/07/2012  . Gout attack 02/07/2012    Past Surgical History:  Procedure Laterality Date  . COLONOSCOPY  04/27/2012   Colonic polyp status post polypectomy. Minimal sigmoid diverticulosis. Small internal hemorrhoids.   . ESOPHAGOGASTRODUODENOSCOPY  06/24/2010   Small hiatal hernia  . HAND SURGERY    . HERNIA REPAIR     With mesh  . ROTATOR CUFF REPAIR    . TONSILLECTOMY    . TONSILLECTOMY AND ADENOIDECTOMY       OB History   None      Home Medications    Prior to Admission medications   Medication Sig Start Date End Date Taking? Authorizing Provider  albuterol (PROVENTIL HFA;VENTOLIN HFA) 108 (90 BASE) MCG/ACT inhaler Inhale 2 puffs into the lungs every 6 (six) hours as needed. For shortness of breath    [provider]  amLODipine (NORVASC) 5 MG tablet Take 1 tablet by mouth daily.    [provider]  budesonide-formoterol (SYMBICORT) 160-4.5 MCG/ACT inhaler Inhale 2 puffs into the lungs daily. 03/12/14   [provider]  Cod Liver Oil 5000-500 UNIT/5ML OIL Take 1 capsule by mouth daily.    [provider]  colchicine 0.6  MG tablet Take 0.6 mg by mouth daily as needed. For gout    [provider]  fluticasone (FLONASE) 50 MCG/ACT nasal spray Place 1 spray into the nose daily.    [provider]  hydrochlorothiazide (HYDRODIURIL) 12.5 MG tablet Take 1 tablet by mouth daily. 04/15/15   [provider]  insulin aspart (NOVOLOG FLEXPEN) 100 UNIT/ML FlexPen Inject into the skin. Three times daily on a sliding scale 01/15/15   [provider]  insulin detemir (LEVEMIR) 100 UNIT/ML injection Inject 15 Units into the skin daily. 02/10/12   Velvet Bathe, MD  levothyroxine (SYNTHROID, LEVOTHROID) 25  MCG tablet Take 50 mcg by mouth every other day. 05/21/14   [provider]  lubiprostone (AMITIZA) 8 MCG capsule Take 1 capsule by mouth daily. 03/10/16   [provider]  Misc Natural Products (COLON CLEANSER PO) Take 1 tablet by mouth daily as needed. For constipation    [provider]  montelukast (SINGULAIR) 10 MG tablet Take 1 tablet by mouth daily. 01/27/15   [provider]  nitroGLYCERIN (NITROSTAT) 0.4 MG SL tablet Place 1 tablet under the tongue as needed for chest pain.    [provider]  Olopatadine HCl 0.7 % SOLN Place 1 drop into both eyes daily. 01/12/15   [provider]  omeprazole (PRILOSEC) 40 MG capsule Take 40 mg by mouth daily.    [provider]  ranitidine (ZANTAC) 150 MG tablet Take 150 mg by mouth daily.    [provider]  traMADol (ULTRAM) 50 MG tablet Take 0.5 tablets (25 mg total) by mouth every 6 (six) hours as needed for pain. 02/10/12   Velvet Bathe, MD  Vitamin D, Ergocalciferol, (DRISDOL) 50000 units CAPS capsule Take 1 capsule by mouth once a week. 04/14/14   [provider]    Family History Family History  Problem Relation Age of Onset  . Diabetes type II Father   . Diabetes type II Son     Social History Social History   Tobacco Use  . Smoking status: Current Every Day Smoker  Substance Use Topics  . Alcohol use: No  . Drug use: Yes    Types: Marijuana     Allergies   Furosemide; Rosuvastatin; Aspirin; and Vicodin [hydrocodone-acetaminophen]   Review of Systems Review of Systems  Constitutional: Positive for fever.  Gastrointestinal: Positive for nausea.  Musculoskeletal: Positive for arthralgias and joint swelling.  All other systems reviewed and are negative.    Physical Exam Updated Vital Signs BP (!) 173/98   Pulse 80   Temp 99.1 F (37.3 C) (Rectal)   Resp 18   SpO2 98%   Physical Exam  Constitutional: She is oriented to person, place, and  time. She appears well-developed and well-nourished. No distress.  HENT:  Head: Normocephalic and atraumatic.  Eyes: Pupils are equal, round, and reactive to light. Conjunctivae are normal. Right eye exhibits no discharge. Left eye exhibits no discharge. No scleral icterus.  Neck: Normal range of motion.  Cardiovascular: Normal rate and regular rhythm.  Pulmonary/Chest: Effort normal and breath sounds normal. No respiratory distress.  Abdominal: She exhibits no distension.  Musculoskeletal:  Left knee: Significant, diffuse swelling of the knee with associated warmth. No redness. Decreased ROM but able to slightly range. 2+ DP pulse  Neurological: She is alert and oriented to person, place, and time.  Skin: Skin is warm and dry.  Psychiatric: She has a normal mood and affect. Her behavior is normal.  Nursing  note and vitals reviewed.    ED Treatments / Results  Labs (all labs ordered are listed, but only abnormal results are displayed) Labs Reviewed  BASIC METABOLIC PANEL - Abnormal; Notable for the following components:      Result Value   Glucose, Bld 157 (*)    All other components within normal limits  CBC WITH DIFFERENTIAL/PLATELET - Abnormal; Notable for the following components:   WBC 11.3 (*)    Neutro Abs 8.2 (*)    Monocytes Absolute 1.2 (*)    All other components within normal limits    EKG None  Radiology Dg Knee Complete 4 Views Left  Result Date: 01/08/2018 CLINICAL DATA:  Pt has had left knee pain for 4 weeks that started at the front of the knee. She now has a palpable lump behind the knee with swelling noted to knee cap area. Pt states she knows she has "bone on bone" to this knee and has had to have fluid removed from it before. EXAM: LEFT KNEE - COMPLETE 4+ VIEW COMPARISON:  05/06/2016 FINDINGS: There are degenerative changes primarily involving the MEDIAL patellofemoral compartments. No acute fracture or subluxation. Moderate joint effusion is present.  IMPRESSION: Degenerative changes.  Moderate joint effusion. Electronically Signed   By: Nolon Nations M.D.   On: 01/08/2018 12:46    Procedures Procedures (including critical care time)  Medications Ordered in ED Medications  oxyCODONE-acetaminophen (PERCOCET/ROXICET) 5-325 MG per tablet 1 tablet (1 tablet Oral Given 01/08/18 1328)     Initial Impression / Assessment and Plan / ED Course  I have reviewed the triage vital signs and the nursing notes.  Pertinent labs & imaging results that were available during my care of the patient were reviewed by me and considered in my medical decision making (see chart for details).  67 year old female presents with acute on chronic left knee pain. Temp is elevated but she is afebrile. Rectal temp is normal. She is hypertensive likely secondary due to pain. On exam she does have swelling and warmth of the joint. No redness and she is able to range the joint slightly. Sensation and distal pulses are intact. She is requesting intraarticular injections and a therapeutic tap today. It was explained to her that this is not a typical procedure performed in the ED. Labs show a chronic mild leukocytosis. BMP shows mild hyperglycemia. Xray shows degenerative changes. Shared visit with Dr. Dayna Barker who also explained this to the patient. We will give her a steroid taper and pain control for presumed gout vs arthritis and she will follow up with her doctor on Thursday.   Final Clinical Impressions(s) / ED Diagnoses   Final diagnoses:  Acute pain of left knee    ED Discharge Orders    None       Recardo Evangelist, PA-C 01/08/18 1512    Mesner, Corene Cornea, MD 01/08/18 [phone]

## 2018-01-10 DIAGNOSIS — Z1231 Encounter for screening mammogram for malignant neoplasm of breast: Secondary | ICD-10-CM | POA: Diagnosis not present

## 2018-01-10 DIAGNOSIS — Z6841 Body Mass Index (BMI) 40.0 and over, adult: Secondary | ICD-10-CM | POA: Diagnosis not present

## 2018-01-10 DIAGNOSIS — Z09 Encounter for follow-up examination after completed treatment for conditions other than malignant neoplasm: Secondary | ICD-10-CM | POA: Diagnosis not present

## 2018-01-10 DIAGNOSIS — Z1331 Encounter for screening for depression: Secondary | ICD-10-CM | POA: Diagnosis not present

## 2018-01-10 DIAGNOSIS — E782 Mixed hyperlipidemia: Secondary | ICD-10-CM | POA: Diagnosis not present

## 2018-01-10 DIAGNOSIS — E119 Type 2 diabetes mellitus without complications: Secondary | ICD-10-CM | POA: Diagnosis not present

## 2018-01-10 DIAGNOSIS — Z9181 History of falling: Secondary | ICD-10-CM | POA: Diagnosis not present

## 2018-01-10 DIAGNOSIS — E559 Vitamin D deficiency, unspecified: Secondary | ICD-10-CM | POA: Diagnosis not present

## 2018-01-10 DIAGNOSIS — Z79899 Other long term (current) drug therapy: Secondary | ICD-10-CM | POA: Diagnosis not present

## 2018-01-10 DIAGNOSIS — M1712 Unilateral primary osteoarthritis, left knee: Secondary | ICD-10-CM | POA: Diagnosis not present

## 2018-01-11 ENCOUNTER — Ambulatory Visit: Payer: Medicare Other | Admitting: Gastroenterology

## 2018-01-19 DIAGNOSIS — E876 Hypokalemia: Secondary | ICD-10-CM | POA: Diagnosis not present

## 2018-01-30 DIAGNOSIS — Z6839 Body mass index (BMI) 39.0-39.9, adult: Secondary | ICD-10-CM | POA: Diagnosis not present

## 2018-01-30 DIAGNOSIS — R635 Abnormal weight gain: Secondary | ICD-10-CM | POA: Diagnosis not present

## 2018-02-04 DIAGNOSIS — M7989 Other specified soft tissue disorders: Secondary | ICD-10-CM | POA: Diagnosis not present

## 2018-02-04 DIAGNOSIS — Z79891 Long term (current) use of opiate analgesic: Secondary | ICD-10-CM | POA: Diagnosis not present

## 2018-02-04 DIAGNOSIS — R600 Localized edema: Secondary | ICD-10-CM | POA: Diagnosis not present

## 2018-02-04 DIAGNOSIS — Z794 Long term (current) use of insulin: Secondary | ICD-10-CM | POA: Diagnosis not present

## 2018-02-04 DIAGNOSIS — M79662 Pain in left lower leg: Secondary | ICD-10-CM | POA: Diagnosis not present

## 2018-02-04 DIAGNOSIS — I10 Essential (primary) hypertension: Secondary | ICD-10-CM | POA: Diagnosis not present

## 2018-02-04 DIAGNOSIS — K219 Gastro-esophageal reflux disease without esophagitis: Secondary | ICD-10-CM | POA: Diagnosis not present

## 2018-02-04 DIAGNOSIS — R609 Edema, unspecified: Secondary | ICD-10-CM | POA: Diagnosis not present

## 2018-02-04 DIAGNOSIS — E119 Type 2 diabetes mellitus without complications: Secondary | ICD-10-CM | POA: Diagnosis not present

## 2018-02-04 DIAGNOSIS — E039 Hypothyroidism, unspecified: Secondary | ICD-10-CM | POA: Diagnosis not present

## 2018-02-04 DIAGNOSIS — M25562 Pain in left knee: Secondary | ICD-10-CM | POA: Diagnosis not present

## 2018-02-04 DIAGNOSIS — Z79899 Other long term (current) drug therapy: Secondary | ICD-10-CM | POA: Diagnosis not present

## 2018-02-04 DIAGNOSIS — Z87891 Personal history of nicotine dependence: Secondary | ICD-10-CM | POA: Diagnosis not present

## 2018-02-07 DIAGNOSIS — E782 Mixed hyperlipidemia: Secondary | ICD-10-CM | POA: Diagnosis not present

## 2018-02-07 DIAGNOSIS — I10 Essential (primary) hypertension: Secondary | ICD-10-CM | POA: Diagnosis not present

## 2018-02-07 DIAGNOSIS — Z6841 Body Mass Index (BMI) 40.0 and over, adult: Secondary | ICD-10-CM | POA: Diagnosis not present

## 2018-02-07 DIAGNOSIS — E119 Type 2 diabetes mellitus without complications: Secondary | ICD-10-CM | POA: Diagnosis not present

## 2018-02-07 DIAGNOSIS — E559 Vitamin D deficiency, unspecified: Secondary | ICD-10-CM | POA: Diagnosis not present

## 2018-02-07 DIAGNOSIS — E039 Hypothyroidism, unspecified: Secondary | ICD-10-CM | POA: Diagnosis not present

## 2018-02-07 DIAGNOSIS — M1990 Unspecified osteoarthritis, unspecified site: Secondary | ICD-10-CM | POA: Diagnosis not present

## 2018-02-07 DIAGNOSIS — R69 Illness, unspecified: Secondary | ICD-10-CM | POA: Diagnosis not present

## 2018-02-07 DIAGNOSIS — J309 Allergic rhinitis, unspecified: Secondary | ICD-10-CM | POA: Diagnosis not present

## 2018-02-09 DIAGNOSIS — M25562 Pain in left knee: Secondary | ICD-10-CM | POA: Diagnosis not present

## 2018-02-09 DIAGNOSIS — M1712 Unilateral primary osteoarthritis, left knee: Secondary | ICD-10-CM | POA: Diagnosis not present

## 2018-02-15 ENCOUNTER — Other Ambulatory Visit (INDEPENDENT_AMBULATORY_CARE_PROVIDER_SITE_OTHER): Payer: Medicare HMO

## 2018-02-15 ENCOUNTER — Encounter: Payer: Self-pay | Admitting: Gastroenterology

## 2018-02-15 ENCOUNTER — Ambulatory Visit (INDEPENDENT_AMBULATORY_CARE_PROVIDER_SITE_OTHER): Payer: Medicare HMO | Admitting: Gastroenterology

## 2018-02-15 VITALS — BP 132/86 | HR 72 | Ht 66.5 in | Wt 249.0 lb

## 2018-02-15 DIAGNOSIS — K439 Ventral hernia without obstruction or gangrene: Secondary | ICD-10-CM

## 2018-02-15 DIAGNOSIS — R109 Unspecified abdominal pain: Secondary | ICD-10-CM | POA: Diagnosis not present

## 2018-02-15 MED ORDER — PLECANATIDE 3 MG PO TABS: 3.0000 mg | ORAL_TABLET | Freq: Every day | ORAL | 0 refills | Status: DC

## 2018-02-15 NOTE — Patient Instructions (Signed)
If you are age 68 or older, your body mass index should be between 23-30. Your Body mass index is 39.59 kg/m. If this is out of the aforementioned range listed, please consider follow up with your Primary Care Provider.  If you are age 21 or younger, your body mass index should be between 19-25. Your Body mass index is 39.59 kg/m. If this is out of the aformentioned range listed, please consider follow up with your Primary Care Provider.   Please go to the lab on the 2nd floor [address] before you leave the office today.   We have given you samples of the following medication to take: Trulance 3 mg once daily (NOT UNTIL AFTER YOU HAVE YOUR CT)  Please purchase the following medications over the counter and take as directed: Miralax twice daily (NOT UNTIL AFTER YOU HAVE YOUR CT)   You have been scheduled for a CT scan of the abdomen and pelvis at [facility].    You are scheduled on 02/22/18 at [place] should arrive 15 minutes prior to your appointment time for registration. Please follow the written instructions below on the day of your exam:  WARNING: IF YOU ARE ALLERGIC TO IODINE/X-RAY DYE, PLEASE NOTIFY RADIOLOGY IMMEDIATELY AT [phone]! YOU WILL BE GIVEN A 13 HOUR PREMEDICATION PREP.  1) Do not eat or drink anything after 4am (4 hours prior to your test) 2) You have been given 2 bottles of oral contrast to drink. The solution may taste better if refrigerated, but do NOT add ice or any other liquid to this solution. Shake well before drinking.    Drink 1 bottle of contrast @ 6am (2 hours prior to your exam)  Drink 1 bottle of contrast @ 7am (1 hour prior to your exam)  You may take any medications as prescribed with a small amount of water, if necessary. If you take any of the following medications: METFORMIN, GLUCOPHAGE, GLUCOVANCE, AVANDAMET, RIOMET, FORTAMET, [place] MET, JANUMET, GLUMETZA or METAGLIP, you MAY be asked to HOLD this medication 48 hours AFTER the  exam.  The purpose of you drinking the oral contrast is to aid in the visualization of your intestinal tract. The contrast solution may cause some diarrhea. Depending on your individual set of symptoms, you may also receive an intravenous injection of x-ray contrast/dye. Plan on being at [facility] for 30 minutes or longer, depending on the type of exam you are having performed.  This test typically takes 30-45 minutes to complete.  If you have any questions regarding your exam or if you need to reschedule, you may call the CT department at [phone] between the hours of 8:00 am and 5:00 pm, Monday-Friday.   .Thank you,  Dr. Jackquline Denmark ________________________________________________________________________

## 2018-02-15 NOTE — Progress Notes (Signed)
Chief Complaint: Abdominal pain  Referring Provider:  Nicholos Johns, MD      ASSESSMENT AND PLAN;   #1. Recurrent andominal pain with large ventral hernia.  Intermittent nausea/vomiting/abdominal bloating #2. Chronic constipation (exacerbated by pain medications) #3. History of colonic polyps (colon 04/2012, colonic polyp status post polypectomy, mild sigmoid diverticulosis, internal hemorrhoids, otherwise normal to TI) #4. Gastroesophageal reflux with small hiatal hernia (EGD 06/2010).  Plan: - CT abdomen/pelvis with p.o. and IV contrast to r/o SBO - Check CBC, CMP, lipase. - EGD and colon with 2-day prep- Jan 2020.  Explained risks and benefits.  Earlier, if still with problems. - Miralax 17g po bid and trilance 4mg  p.o. daily (samples given) after CT. - Minimize pain medications. - Increase water intake - Continue Zantac for now.   HPI:    Michelle Harding is a 67 y.o. female  Very poor historian With generalized abdominal pain over last several years, getting worse over the last 2 to 3 weeks, gets tender over upper abdomen at the site of incisional hernia. Has been diagnosed as having ventral abdominal hernia With associated intermittent nausea/vomiting/abdominal bloating Heartburn is under good control with Zantac. Has history of chronic constipation-exacerbated by pain medications Has been passing gas. Subjective fever/chills No melena or hematochezia No weight loss    Past Medical History:  Diagnosis Date  . Asthma   . Diabetes mellitus   . Heart disease   . Hemorrhoids   . Hypertension   . IBS (irritable bowel syndrome)   . Ovarian cyst     Past Surgical History:  Procedure Laterality Date  . COLONOSCOPY  04/27/2012   Colonic polyp status post polypectomy. Minimal sigmoid diverticulosis. Small internal hemorrhoids.   . ESOPHAGOGASTRODUODENOSCOPY  06/24/2010   Small hiatal hernia  . HAND SURGERY    . HERNIA REPAIR     With mesh  . KNEE SURGERY  2012    . ROTATOR CUFF REPAIR    . TONSILLECTOMY    . TONSILLECTOMY AND ADENOIDECTOMY      Family History  Problem Relation Age of Onset  . Diabetes type II Father   . Diabetes type II Son   . Colon cancer Neg Hx   . Esophageal cancer Neg Hx   . Breast cancer Neg Hx     Social History   Tobacco Use  . Smoking status: Former Research scientist (life sciences)  . Smokeless tobacco: Never Used  . Tobacco comment: did smoke from time to time but stopped 2018  Substance Use Topics  . Alcohol use: Yes    Comment: ocassionally  . Drug use: Not Currently    Types: Marijuana    Comment: use was in the past     Current Outpatient Medications  Medication Sig Dispense Refill  . albuterol (PROVENTIL HFA;VENTOLIN HFA) 108 (90 BASE) MCG/ACT inhaler Inhale 2 puffs into the lungs every 6 (six) hours as needed. For shortness of breath    . Cod Liver Oil 5000-500 UNIT/5ML OIL Take 1 capsule by mouth as needed.     . colchicine 0.6 MG tablet Take 0.6 mg by mouth daily as needed. For gout    . dapagliflozin propanediol (FARXIGA) 5 MG TABS tablet Take 5 mg by mouth daily.    . fluticasone (FLONASE) 50 MCG/ACT nasal spray Place 1 spray into the nose as needed.     . hydrochlorothiazide (HYDRODIURIL) 12.5 MG tablet Take 1 tablet by mouth as needed.     . insulin detemir (LEVEMIR) 100 UNIT/ML  injection Inject 15 Units into the skin daily. 10 mL 0  . levothyroxine (SYNTHROID, LEVOTHROID) 25 MCG tablet Take 50 mcg by mouth every other day.    . montelukast (SINGULAIR) 10 MG tablet Take 1 tablet by mouth daily.    . nitroGLYCERIN (NITROSTAT) 0.4 MG SL tablet Place 1 tablet under the tongue as needed for chest pain.    Marland Kitchen Olopatadine HCl 0.7 % SOLN Place 1 drop into both eyes daily.    Marland Kitchen omeprazole (PRILOSEC) 40 MG capsule Take 40 mg by mouth daily.    Marland Kitchen oxyCODONE-acetaminophen (PERCOCET/ROXICET) 5-325 MG tablet Take 1 tablet by mouth every 4 (four) hours as needed for severe pain. 20 tablet 0  . predniSONE (STERAPRED UNI-PAK 21 TAB) 10  MG (21) TBPK tablet Take by mouth daily. Take 6 tabs by mouth daily  for 2 days, then 5 tabs for 2 days, then 4 tabs for 2 days, then 3 tabs for 2 days, 2 tabs for 2 days, then 1 tab by mouth daily for 2 days 42 tablet 0  . ranitidine (ZANTAC) 150 MG tablet Take 150 mg by mouth daily.    . valsartan (DIOVAN) 320 MG tablet Take 160 mg by mouth daily.    . Vitamin D, Ergocalciferol, (DRISDOL) 50000 units CAPS capsule Take 1 capsule by mouth once a week.    . Misc Natural Products (COLON CLEANSER PO) Take 1 tablet by mouth daily as needed. For constipation     No current facility-administered medications for this visit.     Allergies  Allergen Reactions  . Furosemide Swelling  . Rosuvastatin Swelling  . Aspirin     Patient says it hurts her stomach, has to be coaed  . Vicodin [Hydrocodone-Acetaminophen]     Patient says Vicodin makes her itch    Review of Systems:  Constitutional: Denies fever, chills, diaphoresis, appetite change and has fatigue.  HEENT: Denies photophobia, eye pain, redness, hearing loss, ear pain, congestion, sore throat, rhinorrhea, sneezing, mouth sores, neck pain, neck stiffness and tinnitus.   Respiratory: Denies SOB, DOE, cough, chest tightness,  and wheezing.   Cardiovascular: Denies chest pain, palpitations and leg swelling.  Genitourinary: Denies dysuria, urgency, frequency, hematuria, flank pain and difficulty urinating.  Musculoskeletal: Has myalgias, back pain, joint swelling, arthralgias and gait problem.  Has knee pain Skin: No rash.  Neurological: Denies dizziness, seizures, syncope, weakness, light-headedness, numbness and headaches.  Hematological: Denies adenopathy. Easy bruising, personal or family bleeding history  Psychiatric/Behavioral: Has anxiety or depression     Physical Exam:    BP 132/86   Pulse 72   Ht 5' 6.5" (1.689 m)   Wt 249 lb (112.9 kg)   BMI 39.59 kg/m  Filed Weights   02/15/18 1336  Weight: 249 lb (112.9 kg)    Constitutional:  Well-developed, in no acute distress. Psychiatric: Normal mood and affect. Behavior is normal. HEENT: Pupils normal.  Conjunctivae are normal. No scleral icterus. Neck supple.  Cardiovascular: Normal rate, regular rhythm. No edema Pulmonary/chest: Effort normal and breath sounds normal. No wheezing, rales or rhonchi. Abdominal: Soft, nondistended. Nontender. Bowel sounds active throughout. There are no masses palpable. No hepatomegaly.  Large ventral hernia, well-healed surgical scars. Rectal:  defered Neurological: Alert and oriented to person place and time. Skin: Skin is warm and dry. No rashes noted.  Data Reviewed: I have personally reviewed following labs and imaging studies  CBC: CBC Latest Ref Rng & Units 01/08/2018 02/10/2012 02/09/2012  WBC 4.0 - 10.5 K/uL 11.3(H)  12.9(H) 19.2(H)  Hemoglobin 12.0 - 15.0 g/dL 12.6 10.4(L) 10.9(L)  Hematocrit 36.0 - 46.0 % 39.1 30.5(L) 32.7(L)  Platelets 150 - 400 K/uL 328 407(H) 402(H)    CMP: CMP Latest Ref Rng & Units 01/08/2018 02/09/2012 02/08/2012  Glucose 70 - 99 mg/dL 157(H) 322(H) 383(H)  BUN 8 - 23 mg/dL 9 27(H) 13  Creatinine 0.44 - 1.00 mg/dL 0.93 1.13(H) 0.87  Sodium 135 - 145 mmol/L 142 135 135  Potassium 3.5 - 5.1 mmol/L 3.5 3.7 3.8  Chloride 98 - 111 mmol/L 102 95(L) 95(L)  CO2 22 - 32 mmol/L 31 24 27   Calcium 8.9 - 10.3 mg/dL 9.4 10.1 9.3  Total Protein 6.0 - 8.3 g/dL - - 7.0  Total Bilirubin 0.3 - 1.2 mg/dL - - 0.6  Alkaline Phos 39 - 117 U/L - - 63  AST 0 - 37 U/L - - 10  ALT 0 - 35 U/L - - 8     Carmell Austria, MD 02/15/2018, 2:22 PM  Cc: Nicholos Johns, MD

## 2018-02-16 LAB — COMPREHENSIVE METABOLIC PANEL
ALK PHOS: 60 U/L (ref 39–117)
ALT: 12 U/L (ref 0–35)
AST: 13 U/L (ref 0–37)
Albumin: 4.1 g/dL (ref 3.5–5.2)
BILIRUBIN TOTAL: 0.6 mg/dL (ref 0.2–1.2)
BUN: 34 mg/dL — AB (ref 6–23)
CALCIUM: 10.1 mg/dL (ref 8.4–10.5)
CHLORIDE: 101 meq/L (ref 96–112)
CO2: 30 mEq/L (ref 19–32)
CREATININE: 1.16 mg/dL (ref 0.40–1.20)
GFR: 59.78 mL/min — ABNORMAL LOW (ref >60.00)
Glucose, Bld: 153 mg/dL — ABNORMAL HIGH (ref 70–99)
Potassium: 4.1 mEq/L (ref 3.5–5.1)
Sodium: 140 mEq/L (ref 135–145)
Total Protein: 7 g/dL (ref 6.0–8.3)

## 2018-02-16 LAB — CBC WITH DIFFERENTIAL/PLATELET
BASOS ABS: 0.1 10*3/uL (ref 0.0–0.1)
BASOS PCT: 1.6 % (ref 0.0–3.0)
EOS ABS: 0.1 10*3/uL (ref 0.0–0.7)
Eosinophils Relative: 1.6 % (ref 0.0–5.0)
HCT: 39.7 % (ref 36.0–46.0)
Hemoglobin: 13.1 g/dL (ref 12.0–15.0)
LYMPHS PCT: 42.3 % (ref 12.0–46.0)
Lymphs Abs: 2.1 10*3/uL (ref 0.7–4.0)
MCHC: 33 g/dL (ref 30.0–36.0)
MCV: 91.4 fl (ref 78.0–100.0)
MONO ABS: 0.5 10*3/uL (ref 0.1–1.0)
Monocytes Relative: 10.3 % (ref 3.0–12.0)
Neutro Abs: 2.2 10*3/uL (ref 1.4–7.7)
Neutrophils Relative %: 44.2 % (ref 43.0–77.0)
Platelets: 305 10*3/uL (ref 150.0–400.0)
RBC: 4.34 Mil/uL (ref 3.87–5.11)
RDW: 15.6 % — AB (ref 11.5–15.5)
WBC: 4.9 10*3/uL (ref 4.0–10.5)

## 2018-02-16 LAB — LIPASE: LIPASE: 27 U/L (ref 11.0–59.0)

## 2018-02-17 DIAGNOSIS — M1712 Unilateral primary osteoarthritis, left knee: Secondary | ICD-10-CM | POA: Diagnosis not present

## 2018-02-17 DIAGNOSIS — M25562 Pain in left knee: Secondary | ICD-10-CM | POA: Diagnosis not present

## 2018-02-17 DIAGNOSIS — S83232A Complex tear of medial meniscus, current injury, left knee, initial encounter: Secondary | ICD-10-CM | POA: Diagnosis not present

## 2018-02-17 DIAGNOSIS — X58XXXA Exposure to other specified factors, initial encounter: Secondary | ICD-10-CM | POA: Diagnosis not present

## 2018-02-21 DIAGNOSIS — S83242A Other tear of medial meniscus, current injury, left knee, initial encounter: Secondary | ICD-10-CM | POA: Diagnosis not present

## 2018-02-22 ENCOUNTER — Encounter (HOSPITAL_BASED_OUTPATIENT_CLINIC_OR_DEPARTMENT_OTHER): Payer: Self-pay

## 2018-02-22 ENCOUNTER — Ambulatory Visit (HOSPITAL_BASED_OUTPATIENT_CLINIC_OR_DEPARTMENT_OTHER)
Admission: RE | Admit: 2018-02-22 | Discharge: 2018-02-22 | Disposition: A | Discharge status: Home / Self Care | Payer: Medicare HMO | Source: Ambulatory Visit | Attending: Gastroenterology | Admitting: Gastroenterology

## 2018-02-22 DIAGNOSIS — Z9049 Acquired absence of other specified parts of digestive tract: Secondary | ICD-10-CM | POA: Insufficient documentation

## 2018-02-22 DIAGNOSIS — K439 Ventral hernia without obstruction or gangrene: Secondary | ICD-10-CM | POA: Insufficient documentation

## 2018-02-22 DIAGNOSIS — R109 Unspecified abdominal pain: Secondary | ICD-10-CM | POA: Diagnosis not present

## 2018-02-22 DIAGNOSIS — R140 Abdominal distension (gaseous): Secondary | ICD-10-CM | POA: Diagnosis not present

## 2018-02-22 MED ORDER — IOPAMIDOL (ISOVUE-300) INJECTION 61%
100.0000 mL | Freq: Once | INTRAVENOUS | Status: AC | PRN
  Administered 2018-02-22: 100 mL via INTRAVENOUS

## 2018-02-24 DIAGNOSIS — E782 Mixed hyperlipidemia: Secondary | ICD-10-CM | POA: Diagnosis not present

## 2018-02-24 DIAGNOSIS — I10 Essential (primary) hypertension: Secondary | ICD-10-CM | POA: Diagnosis not present

## 2018-02-24 DIAGNOSIS — Z6841 Body Mass Index (BMI) 40.0 and over, adult: Secondary | ICD-10-CM | POA: Diagnosis not present

## 2018-02-24 DIAGNOSIS — E1142 Type 2 diabetes mellitus with diabetic polyneuropathy: Secondary | ICD-10-CM | POA: Diagnosis not present

## 2018-02-24 DIAGNOSIS — Z794 Long term (current) use of insulin: Secondary | ICD-10-CM | POA: Diagnosis not present

## 2018-03-07 DIAGNOSIS — R69 Illness, unspecified: Secondary | ICD-10-CM | POA: Diagnosis not present

## 2018-03-10 DIAGNOSIS — M1A9XX0 Chronic gout, unspecified, without tophus (tophi): Secondary | ICD-10-CM | POA: Diagnosis not present

## 2018-03-10 DIAGNOSIS — M5442 Lumbago with sciatica, left side: Secondary | ICD-10-CM | POA: Diagnosis not present

## 2018-03-10 DIAGNOSIS — G43009 Migraine without aura, not intractable, without status migrainosus: Secondary | ICD-10-CM | POA: Diagnosis not present

## 2018-03-10 DIAGNOSIS — M1990 Unspecified osteoarthritis, unspecified site: Secondary | ICD-10-CM | POA: Diagnosis not present

## 2018-03-10 DIAGNOSIS — Z79899 Other long term (current) drug therapy: Secondary | ICD-10-CM | POA: Diagnosis not present

## 2018-04-05 DIAGNOSIS — R69 Illness, unspecified: Secondary | ICD-10-CM | POA: Diagnosis not present

## 2018-05-07 DIAGNOSIS — R69 Illness, unspecified: Secondary | ICD-10-CM | POA: Diagnosis not present

## 2018-05-15 DIAGNOSIS — E1142 Type 2 diabetes mellitus with diabetic polyneuropathy: Secondary | ICD-10-CM | POA: Diagnosis not present

## 2018-05-15 DIAGNOSIS — E782 Mixed hyperlipidemia: Secondary | ICD-10-CM | POA: Diagnosis not present

## 2018-05-15 DIAGNOSIS — I10 Essential (primary) hypertension: Secondary | ICD-10-CM | POA: Diagnosis not present

## 2018-05-15 DIAGNOSIS — Z794 Long term (current) use of insulin: Secondary | ICD-10-CM | POA: Diagnosis not present

## 2018-05-16 DIAGNOSIS — E1139 Type 2 diabetes mellitus with other diabetic ophthalmic complication: Secondary | ICD-10-CM | POA: Diagnosis not present

## 2018-05-24 DIAGNOSIS — I10 Essential (primary) hypertension: Secondary | ICD-10-CM | POA: Diagnosis not present

## 2018-05-24 DIAGNOSIS — G8929 Other chronic pain: Secondary | ICD-10-CM | POA: Diagnosis not present

## 2018-05-24 DIAGNOSIS — M5441 Lumbago with sciatica, right side: Secondary | ICD-10-CM | POA: Diagnosis not present

## 2018-06-04 DIAGNOSIS — R69 Illness, unspecified: Secondary | ICD-10-CM | POA: Diagnosis not present

## 2018-06-12 DIAGNOSIS — I10 Essential (primary) hypertension: Secondary | ICD-10-CM | POA: Diagnosis not present

## 2018-06-12 DIAGNOSIS — E119 Type 2 diabetes mellitus without complications: Secondary | ICD-10-CM | POA: Diagnosis not present

## 2018-06-12 DIAGNOSIS — Z2821 Immunization not carried out because of patient refusal: Secondary | ICD-10-CM | POA: Diagnosis not present

## 2018-06-12 DIAGNOSIS — I2510 Atherosclerotic heart disease of native coronary artery without angina pectoris: Secondary | ICD-10-CM | POA: Diagnosis not present

## 2018-06-12 DIAGNOSIS — E039 Hypothyroidism, unspecified: Secondary | ICD-10-CM | POA: Diagnosis not present

## 2018-06-12 DIAGNOSIS — Z6841 Body Mass Index (BMI) 40.0 and over, adult: Secondary | ICD-10-CM | POA: Diagnosis not present

## 2018-06-12 DIAGNOSIS — M1990 Unspecified osteoarthritis, unspecified site: Secondary | ICD-10-CM | POA: Diagnosis not present

## 2018-06-12 DIAGNOSIS — E782 Mixed hyperlipidemia: Secondary | ICD-10-CM | POA: Diagnosis not present

## 2018-06-12 DIAGNOSIS — E559 Vitamin D deficiency, unspecified: Secondary | ICD-10-CM | POA: Diagnosis not present

## 2018-06-21 DIAGNOSIS — Z6841 Body Mass Index (BMI) 40.0 and over, adult: Secondary | ICD-10-CM | POA: Diagnosis not present

## 2018-06-21 DIAGNOSIS — Z79899 Other long term (current) drug therapy: Secondary | ICD-10-CM | POA: Diagnosis not present

## 2018-06-21 DIAGNOSIS — R635 Abnormal weight gain: Secondary | ICD-10-CM | POA: Diagnosis not present

## 2018-07-03 DIAGNOSIS — R69 Illness, unspecified: Secondary | ICD-10-CM | POA: Diagnosis not present

## 2018-07-19 DIAGNOSIS — R05 Cough: Secondary | ICD-10-CM | POA: Diagnosis not present

## 2018-07-19 DIAGNOSIS — X58XXXA Exposure to other specified factors, initial encounter: Secondary | ICD-10-CM | POA: Diagnosis not present

## 2018-07-19 DIAGNOSIS — E119 Type 2 diabetes mellitus without complications: Secondary | ICD-10-CM | POA: Diagnosis not present

## 2018-07-19 DIAGNOSIS — Z794 Long term (current) use of insulin: Secondary | ICD-10-CM | POA: Diagnosis not present

## 2018-07-19 DIAGNOSIS — I10 Essential (primary) hypertension: Secondary | ICD-10-CM | POA: Diagnosis not present

## 2018-07-19 DIAGNOSIS — I4891 Unspecified atrial fibrillation: Secondary | ICD-10-CM | POA: Diagnosis not present

## 2018-07-19 DIAGNOSIS — T7840XA Allergy, unspecified, initial encounter: Secondary | ICD-10-CM | POA: Diagnosis not present

## 2018-07-19 DIAGNOSIS — G8929 Other chronic pain: Secondary | ICD-10-CM | POA: Diagnosis not present

## 2018-07-19 DIAGNOSIS — Z79899 Other long term (current) drug therapy: Secondary | ICD-10-CM | POA: Diagnosis not present

## 2018-08-10 DIAGNOSIS — Z79899 Other long term (current) drug therapy: Secondary | ICD-10-CM | POA: Diagnosis not present

## 2018-08-10 DIAGNOSIS — R635 Abnormal weight gain: Secondary | ICD-10-CM | POA: Diagnosis not present

## 2018-08-14 DIAGNOSIS — R69 Illness, unspecified: Secondary | ICD-10-CM | POA: Diagnosis not present

## 2018-09-06 DIAGNOSIS — J449 Chronic obstructive pulmonary disease, unspecified: Secondary | ICD-10-CM | POA: Diagnosis not present

## 2018-09-06 DIAGNOSIS — R21 Rash and other nonspecific skin eruption: Secondary | ICD-10-CM | POA: Diagnosis not present

## 2018-09-06 DIAGNOSIS — E782 Mixed hyperlipidemia: Secondary | ICD-10-CM | POA: Diagnosis not present

## 2018-09-06 DIAGNOSIS — I10 Essential (primary) hypertension: Secondary | ICD-10-CM | POA: Diagnosis not present

## 2018-09-06 DIAGNOSIS — M1990 Unspecified osteoarthritis, unspecified site: Secondary | ICD-10-CM | POA: Diagnosis not present

## 2018-09-06 DIAGNOSIS — J309 Allergic rhinitis, unspecified: Secondary | ICD-10-CM | POA: Diagnosis not present

## 2018-09-10 DIAGNOSIS — R69 Illness, unspecified: Secondary | ICD-10-CM | POA: Diagnosis not present

## 2018-09-13 DIAGNOSIS — R109 Unspecified abdominal pain: Secondary | ICD-10-CM | POA: Diagnosis not present

## 2018-09-13 DIAGNOSIS — R21 Rash and other nonspecific skin eruption: Secondary | ICD-10-CM | POA: Diagnosis not present

## 2018-09-13 DIAGNOSIS — E119 Type 2 diabetes mellitus without complications: Secondary | ICD-10-CM | POA: Diagnosis not present

## 2018-09-13 DIAGNOSIS — I10 Essential (primary) hypertension: Secondary | ICD-10-CM | POA: Diagnosis not present

## 2018-09-13 DIAGNOSIS — K5900 Constipation, unspecified: Secondary | ICD-10-CM | POA: Diagnosis not present

## 2018-09-13 DIAGNOSIS — K219 Gastro-esophageal reflux disease without esophagitis: Secondary | ICD-10-CM | POA: Diagnosis not present

## 2018-09-14 DIAGNOSIS — Z79899 Other long term (current) drug therapy: Secondary | ICD-10-CM | POA: Diagnosis not present

## 2018-09-14 DIAGNOSIS — R635 Abnormal weight gain: Secondary | ICD-10-CM | POA: Diagnosis not present

## 2018-09-18 DIAGNOSIS — M1A9XX0 Chronic gout, unspecified, without tophus (tophi): Secondary | ICD-10-CM | POA: Diagnosis not present

## 2018-09-18 DIAGNOSIS — M1990 Unspecified osteoarthritis, unspecified site: Secondary | ICD-10-CM | POA: Diagnosis not present

## 2018-09-18 DIAGNOSIS — M2550 Pain in unspecified joint: Secondary | ICD-10-CM | POA: Diagnosis not present

## 2018-09-18 DIAGNOSIS — Z79899 Other long term (current) drug therapy: Secondary | ICD-10-CM | POA: Diagnosis not present

## 2018-09-19 DIAGNOSIS — Z79899 Other long term (current) drug therapy: Secondary | ICD-10-CM | POA: Diagnosis not present

## 2018-09-19 DIAGNOSIS — M1990 Unspecified osteoarthritis, unspecified site: Secondary | ICD-10-CM | POA: Diagnosis not present

## 2018-09-19 DIAGNOSIS — M2550 Pain in unspecified joint: Secondary | ICD-10-CM | POA: Diagnosis not present

## 2018-09-19 DIAGNOSIS — M1A9XX0 Chronic gout, unspecified, without tophus (tophi): Secondary | ICD-10-CM | POA: Diagnosis not present

## 2018-10-04 DIAGNOSIS — E785 Hyperlipidemia, unspecified: Secondary | ICD-10-CM | POA: Diagnosis not present

## 2018-10-04 DIAGNOSIS — E559 Vitamin D deficiency, unspecified: Secondary | ICD-10-CM | POA: Diagnosis not present

## 2018-10-04 DIAGNOSIS — Z79899 Other long term (current) drug therapy: Secondary | ICD-10-CM | POA: Diagnosis not present

## 2018-10-04 DIAGNOSIS — E119 Type 2 diabetes mellitus without complications: Secondary | ICD-10-CM | POA: Diagnosis not present

## 2018-10-04 DIAGNOSIS — M109 Gout, unspecified: Secondary | ICD-10-CM | POA: Diagnosis not present

## 2018-10-04 DIAGNOSIS — E039 Hypothyroidism, unspecified: Secondary | ICD-10-CM | POA: Diagnosis not present

## 2018-10-06 DIAGNOSIS — M109 Gout, unspecified: Secondary | ICD-10-CM | POA: Diagnosis not present

## 2018-10-06 DIAGNOSIS — E119 Type 2 diabetes mellitus without complications: Secondary | ICD-10-CM | POA: Diagnosis not present

## 2018-10-06 DIAGNOSIS — I10 Essential (primary) hypertension: Secondary | ICD-10-CM | POA: Diagnosis not present

## 2018-10-06 DIAGNOSIS — R42 Dizziness and giddiness: Secondary | ICD-10-CM | POA: Diagnosis not present

## 2018-10-06 DIAGNOSIS — K5909 Other constipation: Secondary | ICD-10-CM | POA: Diagnosis not present

## 2018-10-06 DIAGNOSIS — R079 Chest pain, unspecified: Secondary | ICD-10-CM | POA: Diagnosis not present

## 2018-10-09 DIAGNOSIS — R69 Illness, unspecified: Secondary | ICD-10-CM | POA: Diagnosis not present

## 2018-10-10 ENCOUNTER — Encounter: Payer: Self-pay | Admitting: *Deleted

## 2018-10-10 ENCOUNTER — Telehealth: Payer: Self-pay | Admitting: Cardiology

## 2018-10-10 NOTE — Telephone Encounter (Signed)
Virtual Visit Pre-Appointment Phone Call  "(Name), I am calling you today to discuss your upcoming appointment. We are currently trying to limit exposure to the virus that causes COVID-19 by seeing patients at home rather than in the office."  1. "What is the BEST phone number to call the day of the visit?" - include this in appointment notes  2. Do you have or have access to (through a family member/friend) a smartphone with video capability that we can use for your visit?" a. If yes - list this number in appt notes as cell (if different from BEST phone #) and list the appointment type as a VIDEO visit in appointment notes b. If no - list the appointment type as a PHONE visit in appointment notes  3. Confirm consent - "In the setting of the current Covid19 crisis, you are scheduled for a (phone or video) visit with your provider on (date) at (time).  Just as we do with many in-office visits, in order for you to participate in this visit, we must obtain consent.  If you'd like, I can send this to your mychart (if signed up) or email for you to review.  Otherwise, I can obtain your verbal consent now.  All virtual visits are billed to your insurance company just like a normal visit would be.  By agreeing to a virtual visit, we'd like you to understand that the technology does not allow for your provider to perform an examination, and thus may limit your provider's ability to fully assess your condition. If your provider identifies any concerns that need to be evaluated in person, we will make arrangements to do so.  Finally, though the technology is pretty good, we cannot assure that it will always work on either your or our end, and in the setting of a video visit, we may have to convert it to a phone-only visit.  In either situation, we cannot ensure that we have a secure connection.  Are you willing to proceed?" STAFF: Did the patient verbally acknowledge consent to telehealth visit? Document  YES/NO here: YES  4. Advise patient to be prepared - "Two hours prior to your appointment, go ahead and check your blood pressure, pulse, oxygen saturation, and your weight (if you have the equipment to check those) and write them all down. When your visit starts, your provider will ask you for this information. If you have an Apple Watch or Kardia device, please plan to have heart rate information ready on the day of your appointment. Please have a pen and paper handy nearby the day of the visit as well."  5. Give patient instructions for MyChart download to smartphone OR Doximity/Doxy.me as below if video visit (depending on what platform provider is using)  6. Inform patient they will receive a phone call 15 minutes prior to their appointment time (may be from unknown caller ID) so they should be prepared to answer    TELEPHONE CALL NOTE  Michelle Harding has been deemed a candidate for a follow-up tele-health visit to limit community exposure during the Covid-19 pandemic. I spoke with the patient via phone to ensure availability of phone/video source, confirm preferred email & phone number, and discuss instructions and expectations.  I reminded Michelle Harding to be prepared with any vital sign and/or heart rhythm information that could potentially be obtained via home monitoring, at the time of her visit. I reminded Michelle Harding to expect a phone call prior to  her visit.  Frederic Jericho 10/10/2018 11:30 AM   INSTRUCTIONS FOR DOWNLOADING THE MYCHART APP TO SMARTPHONE  - The patient must first make sure to have activated MyChart and know their login information - If Apple, go to [employer] and type in MyChart in the search bar and download the app. If Android, ask patient to go to [employer] and type in [place] in the search bar and download the app. The app is free but as with any other app downloads, their phone may require them to verify saved payment information or Apple/Android  password.  - The patient will need to then log into the app with their MyChart username and password, and select [facility] as their healthcare provider to link the account. When it is time for your visit, go to the MyChart app, find appointments, and click Begin Video Visit. Be sure to Select Allow for your device to access the Microphone and Camera for your visit. You will then be connected, and your provider will be with you shortly.  **If they have any issues connecting, or need assistance please contact MyChart service desk [phone])**  **If using a computer, in order to ensure the best quality for their visit they will need to use either of the following Internet Browsers: [employer], or Google Chrome**  IF USING DOXIMITY or DOXY.ME - The patient will receive a link just prior to their visit by text.     FULL LENGTH CONSENT FOR TELE-HEALTH VISIT   I hereby voluntarily request, consent and authorize [facility] and its employed or contracted physicians, physician assistants, nurse practitioners or other licensed health care professionals (the Practitioner), to provide me with telemedicine health care services (the Services") as deemed necessary by the treating Practitioner. I acknowledge and consent to receive the Services by the Practitioner via telemedicine. I understand that the telemedicine visit will involve communicating with the Practitioner through live audiovisual communication technology and the disclosure of certain medical information by electronic transmission. I acknowledge that I have been given the opportunity to request an in-person assessment or other available alternative prior to the telemedicine visit and am voluntarily participating in the telemedicine visit.  I understand that I have the right to withhold or withdraw my consent to the use of telemedicine in the course of my care at any time, without affecting my right to future care or treatment,  and that the Practitioner or I may terminate the telemedicine visit at any time. I understand that I have the right to inspect all information obtained and/or recorded in the course of the telemedicine visit and may receive copies of available information for a reasonable fee.  I understand that some of the potential risks of receiving the Services via telemedicine include:   Delay or interruption in medical evaluation due to technological equipment failure or disruption;  Information transmitted may not be sufficient (e.g. poor resolution of images) to allow for appropriate medical decision making by the Practitioner; and/or   In rare instances, security protocols could fail, causing a breach of personal health information.  Furthermore, I acknowledge that it is my responsibility to provide information about my medical history, conditions and care that is complete and accurate to the best of my ability. I acknowledge that Practitioner's advice, recommendations, and/or decision may be based on factors not within their control, such as incomplete or inaccurate data provided by me or distortions of diagnostic images or specimens that may result from electronic transmissions. I  understand that the practice of medicine is not an exact science and that Practitioner makes no warranties or guarantees regarding treatment outcomes. I acknowledge that I will receive a copy of this consent concurrently upon execution via email to the email address I last provided but may also request a printed copy by calling the office of [facility].    I understand that my insurance will be billed for this visit.   I have read or had this consent read to me.  I understand the contents of this consent, which adequately explains the benefits and risks of the Services being provided via telemedicine.   I have been provided ample opportunity to ask questions regarding this consent and the Services and have had my questions  answered to my satisfaction.  I give my informed consent for the services to be provided through the use of telemedicine in my medical care  By participating in this telemedicine visit I agree to the above.

## 2018-10-11 DIAGNOSIS — Z79899 Other long term (current) drug therapy: Secondary | ICD-10-CM | POA: Diagnosis not present

## 2018-10-11 DIAGNOSIS — R635 Abnormal weight gain: Secondary | ICD-10-CM | POA: Diagnosis not present

## 2018-10-12 DIAGNOSIS — I10 Essential (primary) hypertension: Secondary | ICD-10-CM | POA: Diagnosis not present

## 2018-10-12 DIAGNOSIS — J309 Allergic rhinitis, unspecified: Secondary | ICD-10-CM | POA: Diagnosis not present

## 2018-10-12 DIAGNOSIS — Z6841 Body Mass Index (BMI) 40.0 and over, adult: Secondary | ICD-10-CM | POA: Diagnosis not present

## 2018-10-18 ENCOUNTER — Telehealth (INDEPENDENT_AMBULATORY_CARE_PROVIDER_SITE_OTHER): Payer: Medicare HMO | Admitting: Cardiology

## 2018-10-18 ENCOUNTER — Other Ambulatory Visit: Payer: Self-pay

## 2018-10-18 ENCOUNTER — Encounter: Payer: Self-pay | Admitting: Cardiology

## 2018-10-18 VITALS — BP 139/77

## 2018-10-18 DIAGNOSIS — E785 Hyperlipidemia, unspecified: Secondary | ICD-10-CM

## 2018-10-18 DIAGNOSIS — E1142 Type 2 diabetes mellitus with diabetic polyneuropathy: Secondary | ICD-10-CM

## 2018-10-18 DIAGNOSIS — I10 Essential (primary) hypertension: Secondary | ICD-10-CM

## 2018-10-18 DIAGNOSIS — I2510 Atherosclerotic heart disease of native coronary artery without angina pectoris: Secondary | ICD-10-CM

## 2018-10-18 DIAGNOSIS — R001 Bradycardia, unspecified: Secondary | ICD-10-CM

## 2018-10-18 DIAGNOSIS — I351 Nonrheumatic aortic (valve) insufficiency: Secondary | ICD-10-CM

## 2018-10-18 NOTE — Patient Instructions (Signed)
Medication Instructions:  Your physician recommends that you continue on your current medications as directed. Please refer to the Current Medication list given to you today.  If you need a refill on your cardiac medications before your next appointment, please call your pharmacy.   Lab work: None.  If you have labs (blood work) drawn today and your tests are completely normal, you will receive your results only by:  [place] (if you have MyChart) OR  A paper copy in the mail If you have any lab test that is abnormal or we need to change your treatment, we will call you to review the results.  Testing/Procedures: Your physician has recommended that you wear a holter monitor. Holter monitors are medical devices that record the hearts electrical activity. Doctors most often use these monitors to diagnose arrhythmias. Arrhythmias are problems with the speed or rhythm of the heartbeat. The monitor is a small, portable device. You can wear one while you do your normal daily activities. This is usually used to diagnose what is causing palpitations/syncope (passing out). Wear for 14 days.   Your physician has requested that you have an echocardiogram. Echocardiography is a painless test that uses sound waves to create images of your heart. It provides your doctor with information about the size and shape of your heart and how well your hearts chambers and valves are working. This procedure takes approximately one hour. There are no restrictions for this procedure.       Follow-Up: At [facility], you and your health needs are our priority.  As part of our continuing mission to provide you with exceptional heart care, we have created designated Provider Care Teams.  These Care Teams include your primary Cardiologist (physician) and Advanced Practice Providers (APPs -  Physician Assistants and Nurse Practitioners) who all work together to provide you with the care you need, when you need  it. You will need a follow up appointment in 6 weeks.  Please call our office 2 months in advance to schedule this appointment.  You may see No primary care provider on file. or another member of our [employer] Provider Team in [place]: Shirlee More, MD  Jyl Heinz, MD  Any Other Special Instructions Will Be Listed Below (If Applicable).   Echocardiogram An echocardiogram is a procedure that uses painless sound waves (ultrasound) to produce an image of the heart. Images from an echocardiogram can provide important information about:  Signs of coronary artery disease (CAD).  Aneurysm detection. An aneurysm is a weak or damaged part of an artery wall that bulges out from the normal force of blood pumping through the body.  Heart size and shape. Changes in the size or shape of the heart can be associated with certain conditions, including heart failure, aneurysm, and CAD.  Heart muscle function.  Heart valve function.  Signs of a past heart attack.  Fluid buildup around the heart.  Thickening of the heart muscle.  A tumor or infectious growth around the heart valves. Tell a health care provider about:  Any allergies you have.  All medicines you are taking, including vitamins, herbs, eye drops, creams, and over-the-counter medicines.  Any blood disorders you have.  Any surgeries you have had.  Any medical conditions you have.  Whether you are pregnant or may be pregnant. What are the risks? Generally, this is a safe procedure. However, problems may occur, including:  Allergic reaction to dye (contrast) that may be used during the procedure. What happens  before the procedure? No specific preparation is needed. You may eat and drink normally. What happens during the procedure?   An IV tube may be inserted into one of your veins.  You may receive contrast through this tube. A contrast is an injection that improves the quality of the pictures from your  heart.  A gel will be applied to your chest.  A wand-like tool (transducer) will be moved over your chest. The gel will help to transmit the sound waves from the transducer.  The sound waves will harmlessly bounce off of your heart to allow the heart images to be captured in real-time motion. The images will be recorded on a computer. The procedure may vary among health care providers and hospitals. What happens after the procedure?  You may return to your normal, everyday life, including diet, activities, and medicines, unless your health care provider tells you not to do that. Summary  An echocardiogram is a procedure that uses painless sound waves (ultrasound) to produce an image of the heart.  Images from an echocardiogram can provide important information about the size and shape of your heart, heart muscle function, heart valve function, and fluid buildup around your heart.  You do not need to do anything to prepare before this procedure. You may eat and drink normally.  After the echocardiogram is completed, you may return to your normal, everyday life, unless your health care provider tells you not to do that. This information is not intended to replace advice given to you by your health care provider. Make sure you discuss any questions you have with your health care provider. Document Released: 04/23/2000 Document Revised: 05/29/2016 Document Reviewed: 05/29/2016 Elsevier Interactive Patient Education  2019 [employer].

## 2018-10-18 NOTE — Addendum Note (Signed)
Addended by: Ashok Norris on: 10/18/2018 03:40 PM   Modules accepted: Orders

## 2018-10-18 NOTE — Progress Notes (Signed)
Virtual Visit via Video Note   This visit type was conducted due to national recommendations for restrictions regarding the COVID-19 Pandemic (e.g. social distancing) in an effort to limit this patient's exposure and mitigate transmission in our community.  Due to her co-morbid illnesses, this patient is at least at moderate risk for complications without adequate follow up.  This format is felt to be most appropriate for this patient at this time.  All issues noted in this document were discussed and addressed.  A limited physical exam was performed with this format.  Please refer to the patient's chart for her consent to telehealth for [facility].  Evaluation Performed:  Follow-up visit  This visit type was conducted due to national recommendations for restrictions regarding the COVID-19 Pandemic (e.g. social distancing).  This format is felt to be most appropriate for this patient at this time.  All issues noted in this document were discussed and addressed.  No physical exam was performed (except for noted visual exam findings with Video Visits).  Please refer to the patient's chart (MyChart message for video visits and phone note for telephone visits) for the patient's consent to telehealth for [facility].  Date:  10/18/2018  ID: Michelle Harding, DOB [birth year], MRN [id number]   Patient Location: [place] [address]   Provider location:   [facility]  PCP:  Nicholos Johns, MD  Cardiologist:  Jenne Campus, MD     Chief Complaint: I have some chest pain  History of Present Illness:    Michelle Harding is a 68 y.o. female  who presents via audio/video conferencing for a telehealth visit today.  I seen her last time in 2016.  I saw her for preop evaluation before abdominal surgery for ovarian mass.  Luckily the mass appears to be benign and she has been doing well her past medical history is quite complex that include hypertension, diabetes which is  poorly controlled, dyslipidemia with intolerance to multiple medications.  In 2013 she had cardiac catheterization with cardiac catheterization showed only luminal disease.  I lost her in my follow-up since 2016 she ended up going to her primary care physician she was started in diltiazem to reduce her blood pressure and she felt absolutely awful on this medication.  She was weak tired exhausted dizzy spells but not to the point of passing out eventually she ended up going to the pharmacy and asking about side effects of this medication and he told her that this is 1 of the side effect of it.  She decided to stop this medication by herself and since that time doing well also described to have some chest pain when she was having a problem with the diltiazem.  Pain she described as located in the upper chest lasting for few minutes typically happening with a stressful situation she graded this sensation 8 in scale up to 10.  There was no sweating no shortness of breath associated with this sensation.  Now since she stopped the diltiazem she is doing better. Her diabetes apparently is difficult to control She does have essential hypertension Also dyslipidemia with poor tolerance of cholesterol medication. She lives a sedentary lifestyle. No history of smoking   The patient does not have symptoms concerning for COVID-19 infection (fever, chills, cough, or new SHORTNESS OF BREATH).    Prior CV studies:   The following studies were reviewed today:       Past Medical History:  Diagnosis Date  .  Aortic regurgitation 08/06/2015  . Asthma   . Bradycardia by electrocardiogram 02/10/2012  . Chronic constipation   . Community acquired pneumonia 02/07/2012  . COPD (chronic obstructive pulmonary disease) ([place])   . Coronary artery disease involving native coronary artery of native heart without angina pectoris 04/24/2015   Luminal disease by cardiac catheterization from 2013  . Diabetes mellitus ([place])  02/07/2012  . Diabetes, polyneuropathy ([place]) 08/06/2015  . Dyslipidemia 04/24/2015  . Gout attack 02/07/2012  . Heart disease   . Hemorrhoids   . Hyperlipidemia 08/06/2015  . Hypertension   . Hypothyroidism 08/06/2015  . IBS (irritable bowel syndrome)   . Meningioma ([place])   . Osteoarthritis   . Ovarian cyst   . Type 2 diabetes mellitus with diabetic polyneuropathy, with long-term current use of insulin ([place]) 08/06/2015    Past Surgical History:  Procedure Laterality Date  . CATARACT EXTRACTION Left 03/2013  . CATARACT EXTRACTION Right 05/2013  . CHOLECYSTECTOMY  1983  . COLONOSCOPY  04/27/2012   Colonic polyp status post polypectomy. Minimal sigmoid diverticulosis. Small internal hemorrhoids.   . ESOPHAGOGASTRODUODENOSCOPY  06/24/2010   Small hiatal hernia  . HAND SURGERY    . HERNIA REPAIR     With mesh  . KNEE SURGERY  2012  . OOPHORECTOMY Left 07/10/2015  . OOPHORECTOMY Right 1989  . ROTATOR CUFF REPAIR    . TONSILLECTOMY    . TONSILLECTOMY AND ADENOIDECTOMY       Current Meds  Medication Sig  . albuterol (PROVENTIL HFA;VENTOLIN HFA) 108 (90 BASE) MCG/ACT inhaler Inhale 2 puffs into the lungs every 6 (six) hours as needed. For shortness of breath  . Cod Liver Oil 5000-500 UNIT/5ML OIL Take 1 capsule by mouth as needed.   . dapagliflozin propanediol (FARXIGA) 5 MG TABS tablet Take 5 mg by mouth daily.  . Flaxseed, Linseed, (GNP FLAX SEED OIL) 1000 MG CAPS Take 1,000 mg by mouth daily.  . Insulin Glargine-Lixisenatide (SOLIQUA) 100-33 UNT-MCG/ML SOPN Inject 50 Units into the skin daily.  Marland Kitchen levothyroxine (SYNTHROID, LEVOTHROID) 25 MCG tablet Take 50 mcg by mouth every other day.  . lubiprostone (AMITIZA) 8 MCG capsule Take 8 mcg by mouth 2 (two) times daily with a meal.  . Misc Natural Products (COLON CLEANSER PO) Take 1 tablet by mouth daily as needed. For constipation  . montelukast (SINGULAIR) 10 MG tablet Take 1 tablet by mouth daily.  . nitroGLYCERIN (NITROSTAT) 0.4 MG  SL tablet Place 1 tablet under the tongue as needed for chest pain.  . phentermine 37.5 MG capsule Take 37.5 mg by mouth every morning.  . valsartan-hydrochlorothiazide (DIOVAN-HCT) 320-25 MG tablet Take 1 tablet by mouth daily.  . vitamin E 1000 UNIT capsule Take 1,000 Units by mouth daily.      Family History: The patient's family history includes Diabetes in her paternal grandmother; Diabetes type II in her father and son; Hypertension in her daughter and paternal grandmother. There is no history of Colon cancer, Esophageal cancer, or Breast cancer.   ROS:   Please see the history of present illness.     All other systems reviewed and are negative.   Labs/Other Tests and Data Reviewed:     Recent Labs: 02/15/2018: ALT 12; BUN 34; Creatinine, Ser 1.16; Hemoglobin 13.1; Platelets 305.0; Potassium 4.1; Sodium 140  Recent Lipid Panel No results found for: CHOL, TRIG, HDL, CHOLHDL, VLDL, LDLCALC, LDLDIRECT    Exam:    Vital Signs:  BP 139/77     Wt Readings from  Last 3 Encounters:  02/15/18 249 lb (112.9 kg)  02/07/12 256 lb 9.9 oz (116.4 kg)     Well nourished, well developed in no acute distress. Alert awake oriented x3 happy to be able to see me again.  Again is seen her last time in 2016 not in any distress she is talking to me while sitting in the kitchen.  She described to have some swelling of lower extremities but states that time she stopped her diltiazem that improved.  Diagnosis for this visit:   1. Coronary artery disease involving native coronary artery of native heart without angina pectoris   2. Nonrheumatic aortic valve insufficiency   3. Essential hypertension   4. Type 2 diabetes mellitus with diabetic polyneuropathy, with long-term current use of insulin ([place])   5. Dyslipidemia   6. Bradycardia by electrocardiogram      ASSESSMENT & PLAN:    1.  Coronary disease cardiac catheterization many years ago showed only luminal disease.  This is something  that we may need to reevaluate in the future.  However I would like to start from getting echocardiogram to assess left ventricular ejection fraction as well as monitor. 2.  History of nonrheumatic aortic valve insufficiency which was assessed as mild.  Echocardiogram will be done to assess this problem 3.  Essential hypertension.  Blood pressure is on the higher side systolic between 268 and 341 I asked her to simply check her blood pressure twice a day for next week or 2 and then sent results to me.  I will not alter any of her medications right now. 4.  Type 2 diabetes apparently difficult to control she said that she feels not well if her sugar is less than 150.  She is on appropriate medication will continue.  But she may require some adjustment in the future. 5.  History of bradycardia I am worried that when she was given diltiazem she developed some heart block.  Hopefully is temporary.  I will ask her to have monitor for 2 weeks to see if there is any problem with her rhythm.  She is feeling much better when she is not taking diltiazem  COVID-19 Education: The signs and symptoms of COVID-19 were discussed with the patient and how to seek care for testing (follow up with PCP or arrange E-visit).  The importance of social distancing was discussed today.  Patient Risk:   After full review of this patients clinical status, I feel that they are at least moderate risk at this time.  Time:   Today, I have spent 26 minutes with the patient with telehealth technology discussing pt health issues.  I spent 5 minutes reviewing her chart before the visit.  Visit was finished at 3:13 PM.    Medication Adjustments/Labs and Tests Ordered: Current medicines are reviewed at length with the patient today.  Concerns regarding medicines are outlined above.  No orders of the defined types were placed in this encounter.  Medication changes: No orders of the defined types were placed in this encounter.     Disposition: Follow-up 6 weeks  Signed, Park Liter, MD, [facility] 10/18/2018 3:14 PM    [place]

## 2018-10-21 DIAGNOSIS — E1142 Type 2 diabetes mellitus with diabetic polyneuropathy: Secondary | ICD-10-CM | POA: Diagnosis not present

## 2018-10-21 DIAGNOSIS — Z794 Long term (current) use of insulin: Secondary | ICD-10-CM | POA: Diagnosis not present

## 2018-10-21 DIAGNOSIS — E039 Hypothyroidism, unspecified: Secondary | ICD-10-CM | POA: Diagnosis not present

## 2018-10-21 DIAGNOSIS — I10 Essential (primary) hypertension: Secondary | ICD-10-CM | POA: Diagnosis not present

## 2018-10-27 DIAGNOSIS — R42 Dizziness and giddiness: Secondary | ICD-10-CM | POA: Diagnosis not present

## 2018-10-27 DIAGNOSIS — E559 Vitamin D deficiency, unspecified: Secondary | ICD-10-CM | POA: Diagnosis not present

## 2018-10-27 DIAGNOSIS — E119 Type 2 diabetes mellitus without complications: Secondary | ICD-10-CM | POA: Diagnosis not present

## 2018-10-27 DIAGNOSIS — I10 Essential (primary) hypertension: Secondary | ICD-10-CM | POA: Diagnosis not present

## 2018-10-27 DIAGNOSIS — E785 Hyperlipidemia, unspecified: Secondary | ICD-10-CM | POA: Diagnosis not present

## 2018-11-07 DIAGNOSIS — E785 Hyperlipidemia, unspecified: Secondary | ICD-10-CM | POA: Diagnosis not present

## 2018-11-07 DIAGNOSIS — Z9049 Acquired absence of other specified parts of digestive tract: Secondary | ICD-10-CM | POA: Diagnosis not present

## 2018-11-07 DIAGNOSIS — M10071 Idiopathic gout, right ankle and foot: Secondary | ICD-10-CM | POA: Diagnosis not present

## 2018-11-07 DIAGNOSIS — M1A9XX0 Chronic gout, unspecified, without tophus (tophi): Secondary | ICD-10-CM | POA: Diagnosis not present

## 2018-11-07 DIAGNOSIS — R509 Fever, unspecified: Secondary | ICD-10-CM | POA: Diagnosis not present

## 2018-11-07 DIAGNOSIS — E119 Type 2 diabetes mellitus without complications: Secondary | ICD-10-CM | POA: Diagnosis not present

## 2018-11-07 DIAGNOSIS — I2510 Atherosclerotic heart disease of native coronary artery without angina pectoris: Secondary | ICD-10-CM | POA: Diagnosis not present

## 2018-11-07 DIAGNOSIS — M25871 Other specified joint disorders, right ankle and foot: Secondary | ICD-10-CM | POA: Diagnosis not present

## 2018-11-07 DIAGNOSIS — I10 Essential (primary) hypertension: Secondary | ICD-10-CM | POA: Diagnosis not present

## 2018-11-07 DIAGNOSIS — Z87891 Personal history of nicotine dependence: Secondary | ICD-10-CM | POA: Diagnosis not present

## 2018-11-07 DIAGNOSIS — M25571 Pain in right ankle and joints of right foot: Secondary | ICD-10-CM | POA: Diagnosis not present

## 2018-11-07 DIAGNOSIS — M7731 Calcaneal spur, right foot: Secondary | ICD-10-CM | POA: Diagnosis not present

## 2018-11-07 DIAGNOSIS — M791 Myalgia, unspecified site: Secondary | ICD-10-CM | POA: Diagnosis not present

## 2018-11-10 DIAGNOSIS — R69 Illness, unspecified: Secondary | ICD-10-CM | POA: Diagnosis not present

## 2018-11-22 DIAGNOSIS — Z0000 Encounter for general adult medical examination without abnormal findings: Secondary | ICD-10-CM | POA: Diagnosis not present

## 2018-11-22 DIAGNOSIS — Z6841 Body Mass Index (BMI) 40.0 and over, adult: Secondary | ICD-10-CM | POA: Diagnosis not present

## 2018-11-22 DIAGNOSIS — Z139 Encounter for screening, unspecified: Secondary | ICD-10-CM | POA: Diagnosis not present

## 2018-11-22 DIAGNOSIS — N959 Unspecified menopausal and perimenopausal disorder: Secondary | ICD-10-CM | POA: Diagnosis not present

## 2018-11-22 DIAGNOSIS — Z1331 Encounter for screening for depression: Secondary | ICD-10-CM | POA: Diagnosis not present

## 2018-11-22 DIAGNOSIS — Z1211 Encounter for screening for malignant neoplasm of colon: Secondary | ICD-10-CM | POA: Diagnosis not present

## 2018-11-22 DIAGNOSIS — E785 Hyperlipidemia, unspecified: Secondary | ICD-10-CM | POA: Diagnosis not present

## 2018-11-22 DIAGNOSIS — Z1231 Encounter for screening mammogram for malignant neoplasm of breast: Secondary | ICD-10-CM | POA: Diagnosis not present

## 2018-11-22 DIAGNOSIS — Z9181 History of falling: Secondary | ICD-10-CM | POA: Diagnosis not present

## 2018-11-24 ENCOUNTER — Other Ambulatory Visit (INDEPENDENT_AMBULATORY_CARE_PROVIDER_SITE_OTHER): Payer: Medicare HMO

## 2018-11-24 ENCOUNTER — Other Ambulatory Visit: Payer: Medicare HMO

## 2018-11-24 DIAGNOSIS — R001 Bradycardia, unspecified: Secondary | ICD-10-CM | POA: Diagnosis not present

## 2018-11-24 DIAGNOSIS — I351 Nonrheumatic aortic (valve) insufficiency: Secondary | ICD-10-CM

## 2018-11-24 DIAGNOSIS — I2510 Atherosclerotic heart disease of native coronary artery without angina pectoris: Secondary | ICD-10-CM | POA: Diagnosis not present

## 2018-11-27 DIAGNOSIS — R635 Abnormal weight gain: Secondary | ICD-10-CM | POA: Diagnosis not present

## 2018-11-27 DIAGNOSIS — Z79899 Other long term (current) drug therapy: Secondary | ICD-10-CM | POA: Diagnosis not present

## 2018-11-28 ENCOUNTER — Telehealth: Payer: Medicare HMO | Admitting: Cardiology

## 2018-11-30 ENCOUNTER — Telehealth: Payer: Medicare HMO | Admitting: Cardiology

## 2018-12-13 DIAGNOSIS — R001 Bradycardia, unspecified: Secondary | ICD-10-CM | POA: Diagnosis not present

## 2018-12-13 DIAGNOSIS — I471 Supraventricular tachycardia: Secondary | ICD-10-CM | POA: Diagnosis not present

## 2018-12-17 DIAGNOSIS — R69 Illness, unspecified: Secondary | ICD-10-CM | POA: Diagnosis not present

## 2019-01-04 ENCOUNTER — Ambulatory Visit (INDEPENDENT_AMBULATORY_CARE_PROVIDER_SITE_OTHER): Payer: Medicare HMO

## 2019-01-04 ENCOUNTER — Other Ambulatory Visit: Payer: Self-pay

## 2019-01-04 DIAGNOSIS — I2510 Atherosclerotic heart disease of native coronary artery without angina pectoris: Secondary | ICD-10-CM

## 2019-01-04 DIAGNOSIS — I351 Nonrheumatic aortic (valve) insufficiency: Secondary | ICD-10-CM

## 2019-01-04 NOTE — Progress Notes (Signed)
Complete echocardiogram has been perfomed.  Jimmy Rawley Harju RDCS, RVT

## 2019-01-11 ENCOUNTER — Other Ambulatory Visit: Payer: Self-pay

## 2019-01-11 ENCOUNTER — Telehealth: Payer: Self-pay | Admitting: Emergency Medicine

## 2019-01-11 ENCOUNTER — Telehealth (INDEPENDENT_AMBULATORY_CARE_PROVIDER_SITE_OTHER): Payer: Medicare HMO | Admitting: Cardiology

## 2019-01-11 ENCOUNTER — Encounter: Payer: Self-pay | Admitting: Cardiology

## 2019-01-11 VITALS — BP 128/77

## 2019-01-11 DIAGNOSIS — R001 Bradycardia, unspecified: Secondary | ICD-10-CM

## 2019-01-11 DIAGNOSIS — I351 Nonrheumatic aortic (valve) insufficiency: Secondary | ICD-10-CM

## 2019-01-11 DIAGNOSIS — I10 Essential (primary) hypertension: Secondary | ICD-10-CM

## 2019-01-11 DIAGNOSIS — I2510 Atherosclerotic heart disease of native coronary artery without angina pectoris: Secondary | ICD-10-CM | POA: Diagnosis not present

## 2019-01-11 NOTE — Telephone Encounter (Signed)
Left message for patient to return call regarding discharge instructions from Dr. Wendy Poet appointment today.

## 2019-01-11 NOTE — Progress Notes (Signed)
Virtual Visit via Video Note   This visit type was conducted due to national recommendations for restrictions regarding the COVID-19 Pandemic (e.g. social distancing) in an effort to limit this patient's exposure and mitigate transmission in our community.  Due to her co-morbid illnesses, this patient is at least at moderate risk for complications without adequate follow up.  This format is felt to be most appropriate for this patient at this time.  All issues noted in this document were discussed and addressed.  A limited physical exam was performed with this format.  Please refer to the patient's chart for her consent to telehealth for [facility].  Evaluation Performed:  Follow-up visit  This visit type was conducted due to national recommendations for restrictions regarding the COVID-19 Pandemic (e.g. social distancing).  This format is felt to be most appropriate for this patient at this time.  All issues noted in this document were discussed and addressed.  No physical exam was performed (except for noted visual exam findings with Video Visits).  Please refer to the patient's chart (MyChart message for video visits and phone note for telephone visits) for the patient's consent to telehealth for [facility].  Date:  01/11/2019  ID: Michelle Harding, DOB 1951/04/08, MRN [id number]   Patient Location: [place] [address]   Provider location:   [place] Office  PCP:  Michelle Johns, MD  Cardiologist:  Michelle Campus, MD     Chief Complaint: Doing fair  History of Present Illness:    Michelle Harding is a 68 y.o. female  who presents via audio/video conferencing for a telehealth visit today.   her past medical history is quite complex that include hypertension, diabetes which is poorly controlled, dyslipidemia with intolerance to multiple medications.  In 2013 she had cardiac catheterization with cardiac catheterization showed only luminal disease.  I lost her in  my follow-up since 2016 she ended up going to her primary care physician she was started in diltiazem to reduce her blood pressure and she felt absolutely awful on this medication.  She was weak tired exhausted dizzy spells but not to the point of passing out eventually she ended up going to the pharmacy and asking about side effects of this medication and he told her that this is 1 of the side effect of it.  She decided to stop this medication by herself and since that time doing well also described to have some chest pain when she was having a problem with the diltiazem.  Pain she described as located in the upper chest lasting for few minutes typically happening with a stressful situation she graded this sensation 8 in scale up to 10.  There was no sweating no shortness of breath associated with this sensation.  Now since she stopped the diltiazem she is doing better. Her diabetes apparently is difficult to control She does have essential hypertension Also dyslipidemia with poor tolerance of cholesterol medication. She lives a sedentary lifestyle. No history of smoking  Today she talks to me sitting in the car in the parking lot of [employer].  Doing well described to have some chest pain does pain happened in different situations sometimes when she does not eat symptoms when she does things.  Last for few minutes.  She take nitroglycerin with relief.  Years ago she had cardiac catheterization done which showed only luminal disease.  However now with her symptomatology and her risk factors we need to reevaluate this problem.  Therefore I will schedule her to have [place].   The patient does not have symptoms concerning for COVID-19 infection (fever, chills, cough, or new SHORTNESS OF BREATH).    Prior CV studies:   The following studies were reviewed today:       Past Medical History:  Diagnosis Date   Aortic regurgitation 08/06/2015   Asthma    Bradycardia by electrocardiogram 02/10/2012     Chronic constipation    Community acquired pneumonia 02/07/2012   COPD (chronic obstructive pulmonary disease) (HCC)    Coronary artery disease involving native coronary artery of native heart without angina pectoris 04/24/2015   Luminal disease by cardiac catheterization from 2013   Diabetes mellitus ([place]) 02/07/2012   Diabetes, polyneuropathy ([place]) 08/06/2015   Dyslipidemia 04/24/2015   Gout attack 02/07/2012   Heart disease    Hemorrhoids    Hyperlipidemia 08/06/2015   Hypertension    Hypothyroidism 08/06/2015   IBS (irritable bowel syndrome)    Meningioma ([place])    Osteoarthritis    Ovarian cyst    Type 2 diabetes mellitus with diabetic polyneuropathy, with long-term current use of insulin ([place]) 08/06/2015    Past Surgical History:  Procedure Laterality Date   CATARACT EXTRACTION Left 03/2013   CATARACT EXTRACTION Right 05/2013   CHOLECYSTECTOMY  1983   COLONOSCOPY  04/27/2012   Colonic polyp status post polypectomy. Minimal sigmoid diverticulosis. Small internal hemorrhoids.    ESOPHAGOGASTRODUODENOSCOPY  06/24/2010   Small hiatal hernia   HAND SURGERY     HERNIA REPAIR     With mesh   KNEE SURGERY  2012   OOPHORECTOMY Left 07/10/2015   OOPHORECTOMY Right 1989   ROTATOR CUFF REPAIR     TONSILLECTOMY     TONSILLECTOMY AND ADENOIDECTOMY       Current Meds  Medication Sig   albuterol (PROVENTIL HFA;VENTOLIN HFA) 108 (90 BASE) MCG/ACT inhaler Inhale 2 puffs into the lungs every 6 (six) hours as needed. For shortness of breath   Cod Liver Oil 5000-500 UNIT/5ML OIL Take 1 capsule by mouth as needed.    dapagliflozin propanediol (FARXIGA) 10 MG TABS tablet Take 10 mg by mouth daily.    Flaxseed, Linseed, (GNP FLAX SEED OIL) 1000 MG CAPS Take 1,000 mg by mouth daily.   Insulin Glargine-Lixisenatide (SOLIQUA) 100-33 UNT-MCG/ML SOPN Inject 50 Units into the skin daily.   levothyroxine (SYNTHROID, LEVOTHROID) 25 MCG tablet Take 50 mcg by  mouth every other day.   lubiprostone (AMITIZA) 8 MCG capsule Take 8 mcg by mouth 2 (two) times daily with a meal.   Misc Natural Products (COLON CLEANSER PO) Take 1 tablet by mouth daily as needed. For constipation   montelukast (SINGULAIR) 10 MG tablet Take 1 tablet by mouth daily.   nitroGLYCERIN (NITROSTAT) 0.4 MG SL tablet Place 1 tablet under the tongue as needed for chest pain.   phentermine 37.5 MG capsule Take 37.5 mg by mouth every morning.   topiramate (TOPAMAX) 25 MG tablet Take 25 mg by mouth 2 (two) times daily.   valsartan-hydrochlorothiazide (DIOVAN-HCT) 320-25 MG tablet Take 1 tablet by mouth daily.   vitamin E 1000 UNIT capsule Take 1,000 Units by mouth daily.      Family History: The patient's family history includes Diabetes in her paternal grandmother; Diabetes type II in her father and son; Hypertension in her daughter and paternal grandmother. There is no history of Colon cancer, Esophageal cancer, or Breast cancer.   ROS:   Please see the history of present illness.  All other systems reviewed and are negative.   Labs/Other Tests and Data Reviewed:     Recent Labs: 02/15/2018: ALT 12; BUN 34; Creatinine, Ser 1.16; Hemoglobin 13.1; Platelets 305.0; Potassium 4.1; Sodium 140  Recent Lipid Panel No results found for: CHOL, TRIG, HDL, CHOLHDL, VLDL, LDLCALC, LDLDIRECT    Exam:    Vital Signs:  BP 128/77     Wt Readings from Last 3 Encounters:  02/15/18 249 lb (112.9 kg)  02/07/12 256 lb 9.9 oz (116.4 kg)     Well nourished, well developed in no acute distress. Alert awake and x3 talking to have a video link sitting in the car in front of [employer].  I am in our office in [place].  She is asymptomatic not in any distress  Diagnosis for this visit:   1. Coronary artery disease involving native coronary artery of native heart without angina pectoris   2. Nonrheumatic aortic valve insufficiency   3. Essential hypertension   4. Bradycardia by  electrocardiogram      ASSESSMENT & PLAN:    1.  Coronary disease stent recheck her stress test which I will do.  I will continue present medications. History of aortic insufficiency echocardiogram show only mild aortic insufficiency we will continue monitoring.  No intervention needed at this stage 3.  Diabetes mellitus followed by internal medicine team. 4.  Bradycardia event recorder/monitor showed slowest heart rate 53.  We will continue monitoring.  Overall cardiac wise she does have some symptoms of chest pain need to be investigated stress test will be done  COVID-19 Education: The signs and symptoms of COVID-19 were discussed with the patient and how to seek care for testing (follow up with PCP or arrange E-visit).  The importance of social distancing was discussed today.  Patient Risk:   After full review of this patients clinical status, I feel that they are at least moderate risk at this time.  Time:   Today, I have spent 15 minutes with the patient with telehealth technology discussing pt health issues.  I spent 5 minutes reviewing her chart before the visit.  Visit was finished at 3:33 PM.    Medication Adjustments/Labs and Tests Ordered: Current medicines are reviewed at length with the patient today.  Concerns regarding medicines are outlined above.  No orders of the defined types were placed in this encounter.  Medication changes: No orders of the defined types were placed in this encounter.    Disposition: Follow-up in 2 months  Signed, Park Liter, MD, [facility] 01/11/2019 3:31 PM    [place]

## 2019-01-11 NOTE — Patient Instructions (Signed)
Medication Instructions:  Your physician recommends that you continue on your current medications as directed. Please refer to the Current Medication list given to you today.  If you need a refill on your cardiac medications before your next appointment, please call your pharmacy.   Lab work: None.  If you have labs (blood work) drawn today and your tests are completely normal, you will receive your results only by: Marland Kitchen MyChart Message (if you have MyChart) OR . A paper copy in the mail If you have any lab test that is abnormal or we need to change your treatment, we will call you to review the results.  Testing/Procedures: Your physician has requested that you have a lexiscan myoview. For further information please visit [email or web address]. Please follow instruction sheet, as given.     [facility] [address] [place], [place] [address] Phone:  [phone]  January 11, 2019    Michelle Harding DOB: 06-22-1950 MRN: [id number] [place] [id number]   Dear Michelle Harding,  You are scheduled for a Myocardial Perfusion Imaging Study on 02/20/2019 and 02/21/2019  at 11 : 15 am Please arrive 15 minutes prior to your appointment time for registration and insurance purposes.  The test will take approximately 3 to 4 hours to complete; you may bring reading material.  If someone comes with you to your appointment, they will need to remain in the main lobby due to limited space in the testing area. **If you are pregnant or breastfeeding, please notify the nuclear lab prior to your appointment**  How to prepare for your Myocardial Perfusion Test: . Do not eat or drink 3 hours prior to your test, except you may have water. . Do not consume products containing caffeine (regular or decaffeinated) 12 hours prior to your test. (ex: coffee, chocolate, sodas, tea). Marland Kitchen HOLD farxiga, insulin glargine, and valsartan-hydrocholorothiazide the day of the test.  . Do  wear comfortable clothes (no dresses or overalls) and walking shoes, tennis shoes preferred (No heels or open toe shoes are allowed). . Do NOT wear cologne, perfume, aftershave, or lotions (deodorant is allowed). . If these instructions are not followed, your test will have to be rescheduled.  Please report to [address] for your test.  If you have questions or concerns about your appointment, you can call the [facility] at [phone].  If you cannot keep your appointment, please provide 24 hours notification to the Nuclear Lab, to avoid a possible $50 charge to your account.   Follow-Up: At [facility], you and your health needs are our priority.  As part of our continuing mission to provide you with exceptional heart care, we have created designated Provider Care Teams.  These Care Teams include your primary Cardiologist (physician) and Advanced Practice Providers (APPs -  Physician Assistants and Nurse Practitioners) who all work together to provide you with the care you need, when you need it. You will need a follow up appointment in 2 months.  Please call our office 2 months in advance to schedule this appointment.  You may see No primary care provider on file. or another member of our [employer] Provider Team in [place]: Shirlee More, MD . Jyl Heinz, MD  Any Other Special Instructions Will Be Listed Below (If Applicable).   Cardiac Nuclear Scan A cardiac nuclear scan is a test that measures blood flow to the heart when a person is resting and when he  or she is exercising. The test looks for problems such as:  Not enough blood reaching a portion of the heart.  The heart muscle not working normally. You may need this test if:  You have heart disease.  You have had abnormal lab results.  You have had heart surgery or a balloon procedure to open up blocked arteries (angioplasty).  You have chest pain.  You have shortness of  breath. In this test, a radioactive dye (tracer) is injected into your bloodstream. After the tracer has traveled to your heart, an imaging device is used to measure how much of the tracer is absorbed by or distributed to various areas of your heart. This procedure is usually done at a hospital and takes 2-4 hours. Tell a health care provider about:  Any allergies you have.  All medicines you are taking, including vitamins, herbs, eye drops, creams, and over-the-counter medicines.  Any problems you or family members have had with anesthetic medicines.  Any blood disorders you have.  Any surgeries you have had.  Any medical conditions you have.  Whether you are pregnant or may be pregnant. What are the risks? Generally, this is a safe procedure. However, problems may occur, including:  Serious chest pain and heart attack. This is only a risk if the stress portion of the test is done.  Rapid heartbeat.  Sensation of warmth in your chest. This usually passes quickly.  Allergic reaction to the tracer. What happens before the procedure?  Ask your health care provider about changing or stopping your regular medicines. This is especially important if you are taking diabetes medicines or blood thinners.  Follow instructions from your health care provider about eating or drinking restrictions.  Remove your jewelry on the day of the procedure. What happens during the procedure?  An IV will be inserted into one of your veins.  Your health care provider will inject a small amount of radioactive tracer through the IV.  You will wait for 20-40 minutes while the tracer travels through your bloodstream.  Your heart activity will be monitored with an electrocardiogram (ECG).  You will lie down on an exam table.  Images of your heart will be taken for about 15-20 minutes.  You may also have a stress test. For this test, one of the following may be done: ? You will exercise on a  treadmill or stationary bike. While you exercise, your heart's activity will be monitored with an ECG, and your blood pressure will be checked. ? You will be given medicines that will increase blood flow to parts of your heart. This is done if you are unable to exercise.  When blood flow to your heart has peaked, a tracer will again be injected through the IV.  After 20-40 minutes, you will get back on the exam table and have more images taken of your heart.  Depending on the type of tracer used, scans may need to be repeated 3-4 hours later.  Your IV line will be removed when the procedure is over. The procedure may vary among health care providers and hospitals. What happens after the procedure?  Unless your health care provider tells you otherwise, you may return to your normal schedule, including diet, activities, and medicines.  Unless your health care provider tells you otherwise, you may increase your fluid intake. This will help to flush the contrast dye from your body. Drink enough fluid to keep your urine pale yellow.  Ask your health care provider, or  the department that is doing the test: ? When will my results be ready? ? How will I get my results? Summary  A cardiac nuclear scan measures the blood flow to the heart when a person is resting and when he or she is exercising.  Tell your health care provider if you are pregnant.  Before the procedure, ask your health care provider about changing or stopping your regular medicines. This is especially important if you are taking diabetes medicines or blood thinners.  After the procedure, unless your health care provider tells you otherwise, increase your fluid intake. This will help flush the contrast dye from your body.  After the procedure, unless your health care provider tells you otherwise, you may return to your normal schedule, including diet, activities, and medicines. This information is not intended to replace advice  given to you by your health care provider. Make sure you discuss any questions you have with your health care provider. Document Released: 05/21/2004 Document Revised: 10/10/2017 Document Reviewed: 10/10/2017 Elsevier Patient Education  2020 [employer].

## 2019-01-17 NOTE — Telephone Encounter (Signed)
Left another message for patient to return call.

## 2019-02-06 DIAGNOSIS — E039 Hypothyroidism, unspecified: Secondary | ICD-10-CM | POA: Diagnosis not present

## 2019-02-06 DIAGNOSIS — E782 Mixed hyperlipidemia: Secondary | ICD-10-CM | POA: Diagnosis not present

## 2019-02-06 DIAGNOSIS — I2510 Atherosclerotic heart disease of native coronary artery without angina pectoris: Secondary | ICD-10-CM | POA: Diagnosis not present

## 2019-02-06 DIAGNOSIS — E785 Hyperlipidemia, unspecified: Secondary | ICD-10-CM | POA: Diagnosis not present

## 2019-02-06 DIAGNOSIS — Z6841 Body Mass Index (BMI) 40.0 and over, adult: Secondary | ICD-10-CM | POA: Diagnosis not present

## 2019-02-06 DIAGNOSIS — K219 Gastro-esophageal reflux disease without esophagitis: Secondary | ICD-10-CM | POA: Diagnosis not present

## 2019-02-06 DIAGNOSIS — M1990 Unspecified osteoarthritis, unspecified site: Secondary | ICD-10-CM | POA: Diagnosis not present

## 2019-02-06 DIAGNOSIS — Z79899 Other long term (current) drug therapy: Secondary | ICD-10-CM | POA: Diagnosis not present

## 2019-02-06 DIAGNOSIS — E119 Type 2 diabetes mellitus without complications: Secondary | ICD-10-CM | POA: Diagnosis not present

## 2019-02-06 DIAGNOSIS — I10 Essential (primary) hypertension: Secondary | ICD-10-CM | POA: Diagnosis not present

## 2019-02-06 DIAGNOSIS — E559 Vitamin D deficiency, unspecified: Secondary | ICD-10-CM | POA: Diagnosis not present

## 2019-02-09 DIAGNOSIS — R69 Illness, unspecified: Secondary | ICD-10-CM | POA: Diagnosis not present

## 2019-02-13 DIAGNOSIS — Z79899 Other long term (current) drug therapy: Secondary | ICD-10-CM | POA: Diagnosis not present

## 2019-02-13 DIAGNOSIS — R635 Abnormal weight gain: Secondary | ICD-10-CM | POA: Diagnosis not present

## 2019-02-14 ENCOUNTER — Telehealth (HOSPITAL_COMMUNITY): Payer: Self-pay | Admitting: *Deleted

## 2019-02-14 NOTE — Telephone Encounter (Signed)
Left message on voicemail in reference to upcoming appointment scheduled for 02/20/19. Phone number given for a call back so details instructions can be given. Kirstie Peri

## 2019-02-19 ENCOUNTER — Telehealth (HOSPITAL_COMMUNITY): Payer: Self-pay | Admitting: *Deleted

## 2019-02-19 NOTE — Telephone Encounter (Signed)
Left message on voicemail in reference to upcoming appointment scheduled for 02/20/19. Phone number given for a call back so details instructions can be given. Michelle Harding

## 2019-02-20 ENCOUNTER — Ambulatory Visit: Payer: Medicare HMO

## 2019-02-21 ENCOUNTER — Ambulatory Visit: Payer: Medicare HMO

## 2019-02-21 ENCOUNTER — Other Ambulatory Visit: Payer: Self-pay

## 2019-02-21 ENCOUNTER — Ambulatory Visit (INDEPENDENT_AMBULATORY_CARE_PROVIDER_SITE_OTHER): Payer: Medicare HMO

## 2019-02-21 DIAGNOSIS — I2510 Atherosclerotic heart disease of native coronary artery without angina pectoris: Secondary | ICD-10-CM

## 2019-02-21 MED ORDER — TECHNETIUM TC 99M TETROFOSMIN IV KIT
30.1000 | PACK | Freq: Once | INTRAVENOUS | Status: AC | PRN
  Administered 2019-02-21: 30.1 via INTRAVENOUS

## 2019-02-21 MED ORDER — REGADENOSON 0.4 MG/5ML IV SOLN
0.4000 mg | Freq: Once | INTRAVENOUS | Status: AC
  Administered 2019-02-21: 0.4 mg via INTRAVENOUS

## 2019-02-22 ENCOUNTER — Ambulatory Visit: Payer: Medicare HMO

## 2019-02-22 LAB — MYOCARDIAL PERFUSION IMAGING
LV dias vol: 116 mL (ref 46–106)
LV sys vol: 50 mL
Peak HR: 121 {beats}/min
Rest HR: 62 {beats}/min
SDS: 5
SRS: 2
SSS: 7
TID: 1.03

## 2019-02-22 MED ORDER — TECHNETIUM TC 99M TETROFOSMIN IV KIT
32.0000 | PACK | Freq: Once | INTRAVENOUS | Status: AC | PRN
  Administered 2019-02-22: 32 via INTRAVENOUS

## 2019-02-24 DIAGNOSIS — Z794 Long term (current) use of insulin: Secondary | ICD-10-CM | POA: Diagnosis not present

## 2019-02-24 DIAGNOSIS — W57XXXA Bitten or stung by nonvenomous insect and other nonvenomous arthropods, initial encounter: Secondary | ICD-10-CM | POA: Diagnosis not present

## 2019-02-24 DIAGNOSIS — Z7952 Long term (current) use of systemic steroids: Secondary | ICD-10-CM | POA: Diagnosis not present

## 2019-02-24 DIAGNOSIS — S40862A Insect bite (nonvenomous) of left upper arm, initial encounter: Secondary | ICD-10-CM | POA: Diagnosis not present

## 2019-02-24 DIAGNOSIS — S40861A Insect bite (nonvenomous) of right upper arm, initial encounter: Secondary | ICD-10-CM | POA: Diagnosis not present

## 2019-02-24 DIAGNOSIS — T148XXA Other injury of unspecified body region, initial encounter: Secondary | ICD-10-CM | POA: Diagnosis not present

## 2019-02-24 DIAGNOSIS — E039 Hypothyroidism, unspecified: Secondary | ICD-10-CM | POA: Diagnosis not present

## 2019-02-24 DIAGNOSIS — S30860A Insect bite (nonvenomous) of lower back and pelvis, initial encounter: Secondary | ICD-10-CM | POA: Diagnosis not present

## 2019-02-24 DIAGNOSIS — Z87891 Personal history of nicotine dependence: Secondary | ICD-10-CM | POA: Diagnosis not present

## 2019-02-24 DIAGNOSIS — I4891 Unspecified atrial fibrillation: Secondary | ICD-10-CM | POA: Diagnosis not present

## 2019-02-24 DIAGNOSIS — G8929 Other chronic pain: Secondary | ICD-10-CM | POA: Diagnosis not present

## 2019-02-24 DIAGNOSIS — E119 Type 2 diabetes mellitus without complications: Secondary | ICD-10-CM | POA: Diagnosis not present

## 2019-02-24 DIAGNOSIS — I10 Essential (primary) hypertension: Secondary | ICD-10-CM | POA: Diagnosis not present

## 2019-03-05 DIAGNOSIS — S40861A Insect bite (nonvenomous) of right upper arm, initial encounter: Secondary | ICD-10-CM | POA: Diagnosis not present

## 2019-03-05 DIAGNOSIS — S40261A Insect bite (nonvenomous) of right shoulder, initial encounter: Secondary | ICD-10-CM | POA: Diagnosis not present

## 2019-03-05 DIAGNOSIS — R21 Rash and other nonspecific skin eruption: Secondary | ICD-10-CM | POA: Diagnosis not present

## 2019-03-05 DIAGNOSIS — Y998 Other external cause status: Secondary | ICD-10-CM | POA: Diagnosis not present

## 2019-03-05 DIAGNOSIS — S80861A Insect bite (nonvenomous), right lower leg, initial encounter: Secondary | ICD-10-CM | POA: Diagnosis not present

## 2019-03-05 DIAGNOSIS — L539 Erythematous condition, unspecified: Secondary | ICD-10-CM | POA: Diagnosis not present

## 2019-03-05 DIAGNOSIS — W57XXXA Bitten or stung by nonvenomous insect and other nonvenomous arthropods, initial encounter: Secondary | ICD-10-CM | POA: Diagnosis not present

## 2019-03-05 DIAGNOSIS — Y92019 Unspecified place in single-family (private) house as the place of occurrence of the external cause: Secondary | ICD-10-CM | POA: Diagnosis not present

## 2019-03-05 DIAGNOSIS — S40862A Insect bite (nonvenomous) of left upper arm, initial encounter: Secondary | ICD-10-CM | POA: Diagnosis not present

## 2019-03-14 DIAGNOSIS — R69 Illness, unspecified: Secondary | ICD-10-CM | POA: Diagnosis not present

## 2019-03-23 ENCOUNTER — Ambulatory Visit: Payer: Medicare HMO | Admitting: Cardiology

## 2019-03-23 DIAGNOSIS — T07XXXA Unspecified multiple injuries, initial encounter: Secondary | ICD-10-CM | POA: Diagnosis not present

## 2019-03-23 DIAGNOSIS — L089 Local infection of the skin and subcutaneous tissue, unspecified: Secondary | ICD-10-CM | POA: Diagnosis not present

## 2019-03-23 DIAGNOSIS — I10 Essential (primary) hypertension: Secondary | ICD-10-CM | POA: Diagnosis not present

## 2019-03-23 DIAGNOSIS — W57XXXA Bitten or stung by nonvenomous insect and other nonvenomous arthropods, initial encounter: Secondary | ICD-10-CM | POA: Diagnosis not present

## 2019-04-16 ENCOUNTER — Ambulatory Visit: Payer: Medicare HMO | Admitting: Cardiology

## 2019-05-15 ENCOUNTER — Ambulatory Visit: Payer: Medicare HMO | Admitting: Cardiology

## 2019-05-15 DIAGNOSIS — L281 Prurigo nodularis: Secondary | ICD-10-CM | POA: Diagnosis not present

## 2019-05-17 DIAGNOSIS — I10 Essential (primary) hypertension: Secondary | ICD-10-CM | POA: Diagnosis not present

## 2019-05-17 DIAGNOSIS — E119 Type 2 diabetes mellitus without complications: Secondary | ICD-10-CM | POA: Diagnosis not present

## 2019-05-17 DIAGNOSIS — E785 Hyperlipidemia, unspecified: Secondary | ICD-10-CM | POA: Diagnosis not present

## 2019-06-19 ENCOUNTER — Ambulatory Visit: Payer: Medicare Other | Admitting: Cardiology

## 2019-07-11 ENCOUNTER — Ambulatory Visit: Payer: Medicare Other | Admitting: Cardiology

## 2019-08-13 DIAGNOSIS — M1711 Unilateral primary osteoarthritis, right knee: Secondary | ICD-10-CM | POA: Diagnosis not present

## 2019-08-13 DIAGNOSIS — M25561 Pain in right knee: Secondary | ICD-10-CM | POA: Diagnosis not present

## 2019-08-13 DIAGNOSIS — S838X1A Sprain of other specified parts of right knee, initial encounter: Secondary | ICD-10-CM | POA: Diagnosis not present

## 2019-08-13 DIAGNOSIS — R531 Weakness: Secondary | ICD-10-CM | POA: Diagnosis not present

## 2019-08-13 DIAGNOSIS — M1710 Unilateral primary osteoarthritis, unspecified knee: Secondary | ICD-10-CM | POA: Diagnosis not present

## 2019-08-13 DIAGNOSIS — S8391XA Sprain of unspecified site of right knee, initial encounter: Secondary | ICD-10-CM | POA: Diagnosis not present

## 2019-08-20 DIAGNOSIS — M25461 Effusion, right knee: Secondary | ICD-10-CM | POA: Diagnosis not present

## 2019-08-20 DIAGNOSIS — M19071 Primary osteoarthritis, right ankle and foot: Secondary | ICD-10-CM | POA: Diagnosis not present

## 2019-08-20 DIAGNOSIS — M1711 Unilateral primary osteoarthritis, right knee: Secondary | ICD-10-CM | POA: Diagnosis not present

## 2019-08-27 DIAGNOSIS — I10 Essential (primary) hypertension: Secondary | ICD-10-CM | POA: Diagnosis not present

## 2019-08-27 DIAGNOSIS — E119 Type 2 diabetes mellitus without complications: Secondary | ICD-10-CM | POA: Diagnosis not present

## 2019-08-27 DIAGNOSIS — E785 Hyperlipidemia, unspecified: Secondary | ICD-10-CM | POA: Diagnosis not present

## 2019-08-28 DIAGNOSIS — E119 Type 2 diabetes mellitus without complications: Secondary | ICD-10-CM | POA: Diagnosis not present

## 2019-08-28 DIAGNOSIS — E782 Mixed hyperlipidemia: Secondary | ICD-10-CM | POA: Diagnosis not present

## 2019-08-28 DIAGNOSIS — E039 Hypothyroidism, unspecified: Secondary | ICD-10-CM | POA: Diagnosis not present

## 2019-08-28 DIAGNOSIS — Z79899 Other long term (current) drug therapy: Secondary | ICD-10-CM | POA: Diagnosis not present

## 2019-08-30 DIAGNOSIS — E1142 Type 2 diabetes mellitus with diabetic polyneuropathy: Secondary | ICD-10-CM | POA: Diagnosis not present

## 2019-08-30 DIAGNOSIS — I10 Essential (primary) hypertension: Secondary | ICD-10-CM | POA: Diagnosis not present

## 2019-08-30 DIAGNOSIS — E039 Hypothyroidism, unspecified: Secondary | ICD-10-CM | POA: Diagnosis not present

## 2019-08-30 DIAGNOSIS — Z794 Long term (current) use of insulin: Secondary | ICD-10-CM | POA: Diagnosis not present

## 2019-09-04 DIAGNOSIS — Z794 Long term (current) use of insulin: Secondary | ICD-10-CM | POA: Diagnosis not present

## 2019-09-04 DIAGNOSIS — H52203 Unspecified astigmatism, bilateral: Secondary | ICD-10-CM | POA: Diagnosis not present

## 2019-09-04 DIAGNOSIS — E119 Type 2 diabetes mellitus without complications: Secondary | ICD-10-CM | POA: Diagnosis not present

## 2019-09-04 DIAGNOSIS — H35033 Hypertensive retinopathy, bilateral: Secondary | ICD-10-CM | POA: Diagnosis not present

## 2019-09-04 DIAGNOSIS — H5203 Hypermetropia, bilateral: Secondary | ICD-10-CM | POA: Diagnosis not present

## 2019-09-10 ENCOUNTER — Ambulatory Visit: Payer: Medicare Other | Admitting: Cardiology

## 2019-09-10 ENCOUNTER — Encounter: Payer: Self-pay | Admitting: Cardiology

## 2019-09-10 ENCOUNTER — Other Ambulatory Visit: Payer: Self-pay

## 2019-09-10 VITALS — BP 142/90 | HR 70 | Ht 66.0 in | Wt 256.6 lb

## 2019-09-10 DIAGNOSIS — E782 Mixed hyperlipidemia: Secondary | ICD-10-CM

## 2019-09-10 DIAGNOSIS — I2510 Atherosclerotic heart disease of native coronary artery without angina pectoris: Secondary | ICD-10-CM

## 2019-09-10 DIAGNOSIS — I351 Nonrheumatic aortic (valve) insufficiency: Secondary | ICD-10-CM

## 2019-09-10 NOTE — Progress Notes (Signed)
Cardiology Office Note:    Date:  09/10/2019   ID:  ALETSE BIONDOLILLO, DOB 1950-12-28, MRN [id number]  PCP:  Nicholos Johns, MD  Cardiologist:  Jenne Campus, MD    Referring MD: Nicholos Johns, MD   No chief complaint on file. Doing well  History of Present Illness:    Michelle Harding is a 69 y.o. female with past medical history significant for coronary artery disease, cardiac catheterization 2013 showed only luminal disease, recent stress test done for atypical chest pain showed no evidence of ischemia, also history of hypertension, dyslipidemia, diabetes mellitus.  She comes today to my office for follow-up.  Overall seems to be doing well.  Denies have any chest pain tightness squeezing pressure burning chest.  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  This too is why she does not exercise on a regular basis.  Past Medical History:  Diagnosis Date  . Aortic regurgitation 08/06/2015  . Asthma   . Bradycardia by electrocardiogram 02/10/2012  . Chronic constipation   . Community acquired pneumonia 02/07/2012  . COPD (chronic obstructive pulmonary disease) ([place])   . Coronary artery disease involving native coronary artery of native heart without angina pectoris 04/24/2015   Luminal disease by cardiac catheterization from 2013  . Diabetes mellitus ([place]) 02/07/2012  . Diabetes, polyneuropathy ([place]) 08/06/2015  . Dyslipidemia 04/24/2015  . Gout attack 02/07/2012  . Heart disease   . Hemorrhoids   . Hyperlipidemia 08/06/2015  . Hypertension   . Hypothyroidism 08/06/2015  . IBS (irritable bowel syndrome)   . Meningioma ([place])   . Osteoarthritis   . Ovarian cyst   . Type 2 diabetes mellitus with diabetic polyneuropathy, with long-term current use of insulin ([place]) 08/06/2015    Past Surgical History:  Procedure Laterality Date  . CATARACT EXTRACTION Left 03/2013  . CATARACT EXTRACTION Right 05/2013  . CHOLECYSTECTOMY  1983  . COLONOSCOPY   04/27/2012   Colonic polyp status post polypectomy. Minimal sigmoid diverticulosis. Small internal hemorrhoids.   . ESOPHAGOGASTRODUODENOSCOPY  06/24/2010   Small hiatal hernia  . HAND SURGERY    . HERNIA REPAIR     With mesh  . KNEE SURGERY  2012  . OOPHORECTOMY Left 07/10/2015  . OOPHORECTOMY Right 1989  . ROTATOR CUFF REPAIR    . TONSILLECTOMY    . TONSILLECTOMY AND ADENOIDECTOMY      Current Medications: Current Meds  Medication Sig  . albuterol (PROVENTIL HFA;VENTOLIN HFA) 108 (90 BASE) MCG/ACT inhaler Inhale 2 puffs into the lungs every 6 (six) hours as needed. For shortness of breath  . allopurinol (ZYLOPRIM) 300 MG tablet Take 300 mg by mouth daily.  . Cod Liver Oil 5000-500 UNIT/5ML OIL Take 1 capsule by mouth as needed.   . colchicine 0.6 MG tablet Take 0.6 mg by mouth as needed.  . dapagliflozin propanediol (FARXIGA) 10 MG TABS tablet Take 10 mg by mouth daily.   . diclofenac (VOLTAREN) 50 MG EC tablet Take 50 mg by mouth as needed.  . Flaxseed, Linseed, (GNP FLAX SEED OIL) 1000 MG CAPS Take 1,000 mg by mouth daily.  . Insulin Glargine-Lixisenatide (SOLIQUA) 100-33 UNT-MCG/ML SOPN Inject 50 Units into the skin daily.  Marland Kitchen levothyroxine (SYNTHROID, LEVOTHROID) 25 MCG tablet Take 50 mcg by mouth every other day.  . lubiprostone (AMITIZA) 8 MCG capsule Take 8 mcg by mouth 2 (two) times daily with a meal.  . Misc Natural Products (COLON CLEANSER PO) Take 1  tablet by mouth daily as needed. For constipation  . montelukast (SINGULAIR) 10 MG tablet Take 1 tablet by mouth daily.  . nitroGLYCERIN (NITROSTAT) 0.4 MG SL tablet Place 1 tablet under the tongue as needed for chest pain.  Marland Kitchen nystatin cream (MYCOSTATIN) Apply 1 application topically as needed.  Marland Kitchen omeprazole (PRILOSEC) 20 MG capsule Take 20 mg by mouth daily.  . permethrin (ELIMITE) 5 % cream Apply 1 application topically once a week.  . phentermine 37.5 MG capsule Take 37.5 mg by mouth every morning.  . predniSONE  (DELTASONE) 5 MG tablet Take 5 mg by mouth as needed.  Marland Kitchen REPATHA SURECLICK [id number] MG/ML SOAJ Inject as directed every 14 (fourteen) days.  Marland Kitchen topiramate (TOPAMAX) 25 MG tablet Take 25 mg by mouth 2 (two) times daily.  Nelva Nay SOLOSTAR 300 UNIT/ML Solostar Pen daily.  . traMADol (ULTRAM) 50 MG tablet Take 50 mg by mouth as needed.  . TRULICITY 1.5 0000000 SOPN Inject into the skin once a week.  . valsartan-hydrochlorothiazide (DIOVAN-HCT) 320-25 MG tablet Take 1 tablet by mouth daily.  . Vitamin D, Ergocalciferol, (DRISDOL) 1.25 MG (50000 UNIT) CAPS capsule Take 50,000 Units by mouth once a week.  . vitamin E 1000 UNIT capsule Take 1,000 Units by mouth daily.     Allergies:   Statins, Furosemide, Rosuvastatin, Aspirin, and Vicodin [hydrocodone-acetaminophen]   Social History   Socioeconomic History  . Marital status: Divorced    Spouse name: Not on file  . Number of children: 7  . Years of education: Not on file  . Highest education level: Not on file  Occupational History  . Occupation: Retired   Tobacco Use  . Smoking status: Never Smoker  . Smokeless tobacco: Never Used  . Tobacco comment: did smoke from time to time but stopped 2018  Substance and Sexual Activity  . Alcohol use: Yes    Comment: ocassionally  . Drug use: Not Currently    Types: Marijuana    Comment: use was in the past   . Sexual activity: Never  Other Topics Concern  . Not on file  Social History Narrative  . Not on file   Social Determinants of Health   Financial Resource Strain:   . Difficulty of Paying Living Expenses:   Food Insecurity:   . Worried About Charity fundraiser in the Last Year:   . Arboriculturist in the Last Year:   Transportation Needs:   . Film/video editor (Medical):   Marland Kitchen Lack of Transportation (Non-Medical):   Physical Activity:   . Days of Exercise per Week:   . Minutes of Exercise per Session:   Stress:   . Feeling of Stress :   Social Connections:   . Frequency of  Communication with Friends and Family:   . Frequency of Social Gatherings with Friends and Family:   . Attends Religious Services:   . Active Member of Clubs or Organizations:   . Attends Archivist Meetings:   Marland Kitchen Marital Status:      Family History: The patient's family history includes Diabetes in her paternal grandmother; Diabetes type II in her father and son; Hypertension in her daughter and paternal grandmother. There is no history of Colon cancer, Esophageal cancer, or Breast cancer. ROS:   Please see the history of present illness.    All 14 point review of systems negative except as described per history of present illness  EKGs/Labs/Other Studies Reviewed:    Echocardiogram showed  1. The left ventricle has normal systolic function with an ejection  fraction of 60-65%. The cavity size was normal. There is severe concentric  left ventricular hypertrophy. Left ventricular diastolic Doppler  parameters are consistent with impaired  relaxation. No evidence of left ventricular regional wall motion  abnormalities.  2. The right ventricle has normal systolic function. The cavity was  normal. There is no increase in right ventricular wall thickness.  3. No evidence of mitral valve stenosis.  4. The aortic valve is tricuspid. Mild sclerosis of the aortic valve.  Aortic valve regurgitation is mild by color flow Doppler. No stenosis of  the aortic valve.  5. The aortic root and ascending aorta are normal in size and structure.    Stress test done on 22 February 2019 showed:  The left ventricular ejection fraction is normal (55-65%).  Nuclear stress EF: 57%.  There was no ST segment deviation noted during stress.  The study is normal.  This is a low risk study.  Recent Labs: No results found for requested labs within last 8760 hours.  Recent Lipid Panel No results found for: CHOL, TRIG, HDL, CHOLHDL, VLDL, LDLCALC, LDLDIRECT  Physical Exam:    VS:  BP (!)  142/90   Pulse 70   Ht 5\' 6"  (1.676 m)   Wt 256 lb 9.6 oz (116.4 kg)   SpO2 97%   BMI 41.42 kg/m     Wt Readings from Last 3 Encounters:  09/10/19 256 lb 9.6 oz (116.4 kg)  02/21/19 250 lb (113.4 kg)  02/15/18 249 lb (112.9 kg)     GEN:  Well nourished, well developed in no acute distress HEENT: Normal NECK: No JVD; No carotid bruits LYMPHATICS: No lymphadenopathy CARDIAC: RRR, no murmurs, no rubs, no gallops RESPIRATORY:  Clear to auscultation without rales, wheezing or rhonchi  ABDOMEN: Soft, non-tender, non-distended MUSCULOSKELETAL:  No edema; No deformity  SKIN: Warm and dry LOWER EXTREMITIES: no swelling NEUROLOGIC:  Alert and oriented x 3 PSYCHIATRIC:  Normal affect   ASSESSMENT:    1. Coronary artery disease involving native coronary artery of native heart without angina pectoris   2. Nonrheumatic aortic valve insufficiency   3. Mixed hyperlipidemia    PLAN:    In order of problems listed above:  1. Coronary artery disease, last stress test done in October 2020 showed no evidence of ischemia, will continue risk factors modifications.  She is taking aspirin which I will continue. 2. Nonrheumatic aortic valve insufficiency only mild based on last echocardiogram.  We will continue present management 3. Mixed dyslipidemia she is intolerant to statin however she is being initiated with Repatha.  I did review her K PN which showed LDL of 123 but that was done just a week after she received first shot of Repatha.  We will repeat the test within the next 6 weeks or so. 4. Arterial sufficiency only mild.  Continue conservative approach   Medication Adjustments/Labs and Tests Ordered: Current medicines are reviewed at length with the patient today.  Concerns regarding medicines are outlined above.  No orders of the defined types were placed in this encounter.  Medication changes: No orders of the defined types were placed in this encounter.   Signed, Park Liter, MD, [facility] 09/10/2019 3:40 PM    [place]

## 2019-09-10 NOTE — Patient Instructions (Signed)

## 2019-10-11 DIAGNOSIS — R635 Abnormal weight gain: Secondary | ICD-10-CM | POA: Diagnosis not present

## 2019-10-11 DIAGNOSIS — Z79899 Other long term (current) drug therapy: Secondary | ICD-10-CM | POA: Diagnosis not present

## 2019-10-12 DIAGNOSIS — R600 Localized edema: Secondary | ICD-10-CM | POA: Diagnosis not present

## 2019-10-12 DIAGNOSIS — M1990 Unspecified osteoarthritis, unspecified site: Secondary | ICD-10-CM | POA: Diagnosis not present

## 2019-10-12 DIAGNOSIS — N950 Postmenopausal bleeding: Secondary | ICD-10-CM | POA: Diagnosis not present

## 2019-10-12 DIAGNOSIS — N6310 Unspecified lump in the right breast, unspecified quadrant: Secondary | ICD-10-CM | POA: Diagnosis not present

## 2019-10-24 DIAGNOSIS — N6310 Unspecified lump in the right breast, unspecified quadrant: Secondary | ICD-10-CM | POA: Diagnosis not present

## 2019-10-24 DIAGNOSIS — N6311 Unspecified lump in the right breast, upper outer quadrant: Secondary | ICD-10-CM | POA: Diagnosis not present

## 2019-10-24 DIAGNOSIS — R928 Other abnormal and inconclusive findings on diagnostic imaging of breast: Secondary | ICD-10-CM | POA: Diagnosis not present

## 2019-11-27 DIAGNOSIS — Z6841 Body Mass Index (BMI) 40.0 and over, adult: Secondary | ICD-10-CM | POA: Diagnosis not present

## 2019-11-27 DIAGNOSIS — Z0000 Encounter for general adult medical examination without abnormal findings: Secondary | ICD-10-CM | POA: Diagnosis not present

## 2019-11-27 DIAGNOSIS — Z1331 Encounter for screening for depression: Secondary | ICD-10-CM | POA: Diagnosis not present

## 2019-11-27 DIAGNOSIS — E785 Hyperlipidemia, unspecified: Secondary | ICD-10-CM | POA: Diagnosis not present

## 2019-12-05 DIAGNOSIS — I10 Essential (primary) hypertension: Secondary | ICD-10-CM | POA: Diagnosis not present

## 2019-12-05 DIAGNOSIS — I2510 Atherosclerotic heart disease of native coronary artery without angina pectoris: Secondary | ICD-10-CM | POA: Diagnosis not present

## 2019-12-05 DIAGNOSIS — M1990 Unspecified osteoarthritis, unspecified site: Secondary | ICD-10-CM | POA: Diagnosis not present

## 2019-12-05 DIAGNOSIS — K219 Gastro-esophageal reflux disease without esophagitis: Secondary | ICD-10-CM | POA: Diagnosis not present

## 2019-12-05 DIAGNOSIS — E119 Type 2 diabetes mellitus without complications: Secondary | ICD-10-CM | POA: Diagnosis not present

## 2019-12-05 DIAGNOSIS — E559 Vitamin D deficiency, unspecified: Secondary | ICD-10-CM | POA: Diagnosis not present

## 2019-12-05 DIAGNOSIS — E785 Hyperlipidemia, unspecified: Secondary | ICD-10-CM | POA: Diagnosis not present

## 2019-12-05 DIAGNOSIS — L989 Disorder of the skin and subcutaneous tissue, unspecified: Secondary | ICD-10-CM | POA: Diagnosis not present

## 2019-12-05 DIAGNOSIS — E039 Hypothyroidism, unspecified: Secondary | ICD-10-CM | POA: Diagnosis not present

## 2020-01-14 IMAGING — DX DG KNEE COMPLETE 4+V*L*
4 series · 4 of 4 positions shown · non-contrast
Comparison: 05/06/2016

CLINICAL DATA: Pt has had left knee pain for 4 weeks that started
at the front of the knee. She now has a palpable lump behind the
knee with swelling noted to knee cap area. Pt states she knows she
has "bone on bone" to this knee and has had to have fluid removed
from it before.

EXAM:
LEFT KNEE - COMPLETE 4+ VIEW

[knee ap]
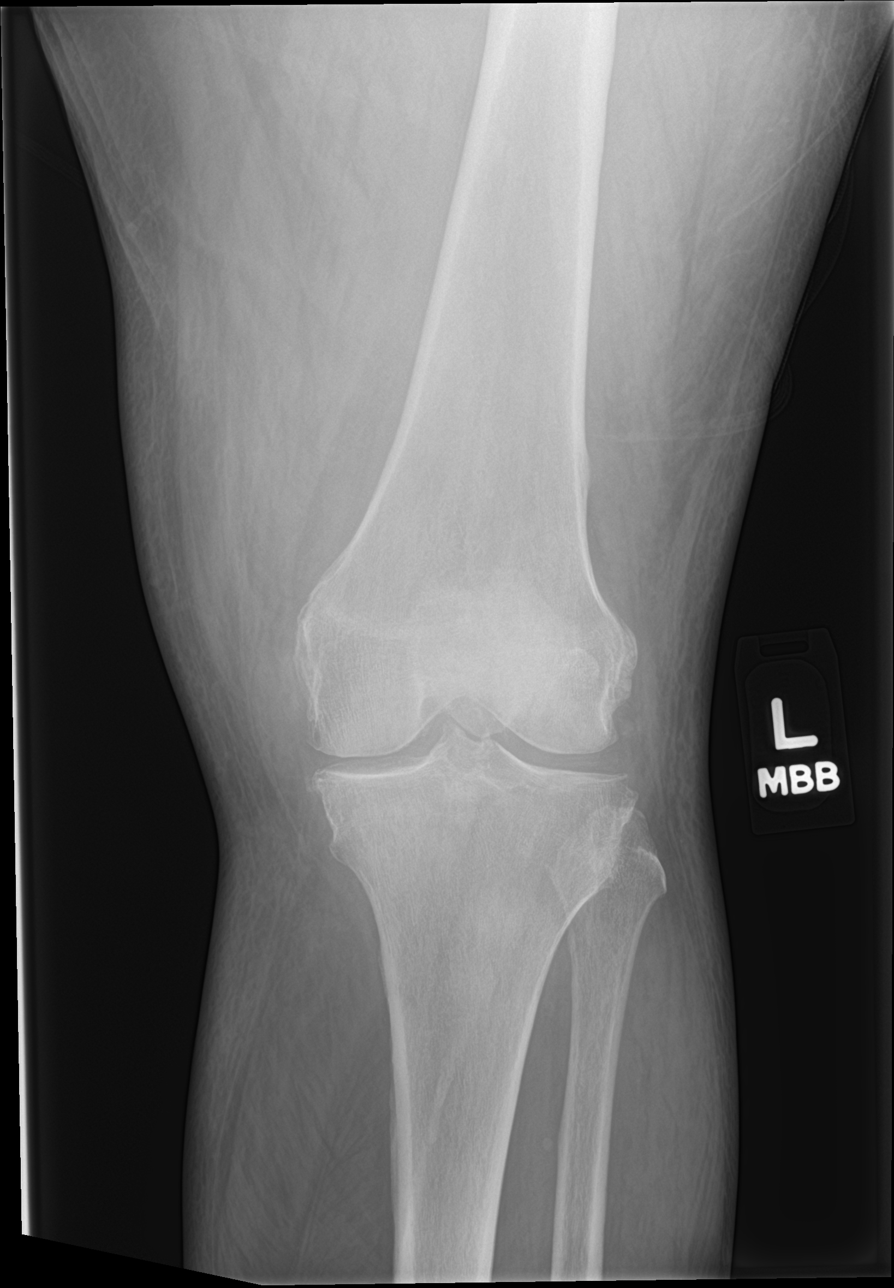

[knee lat]
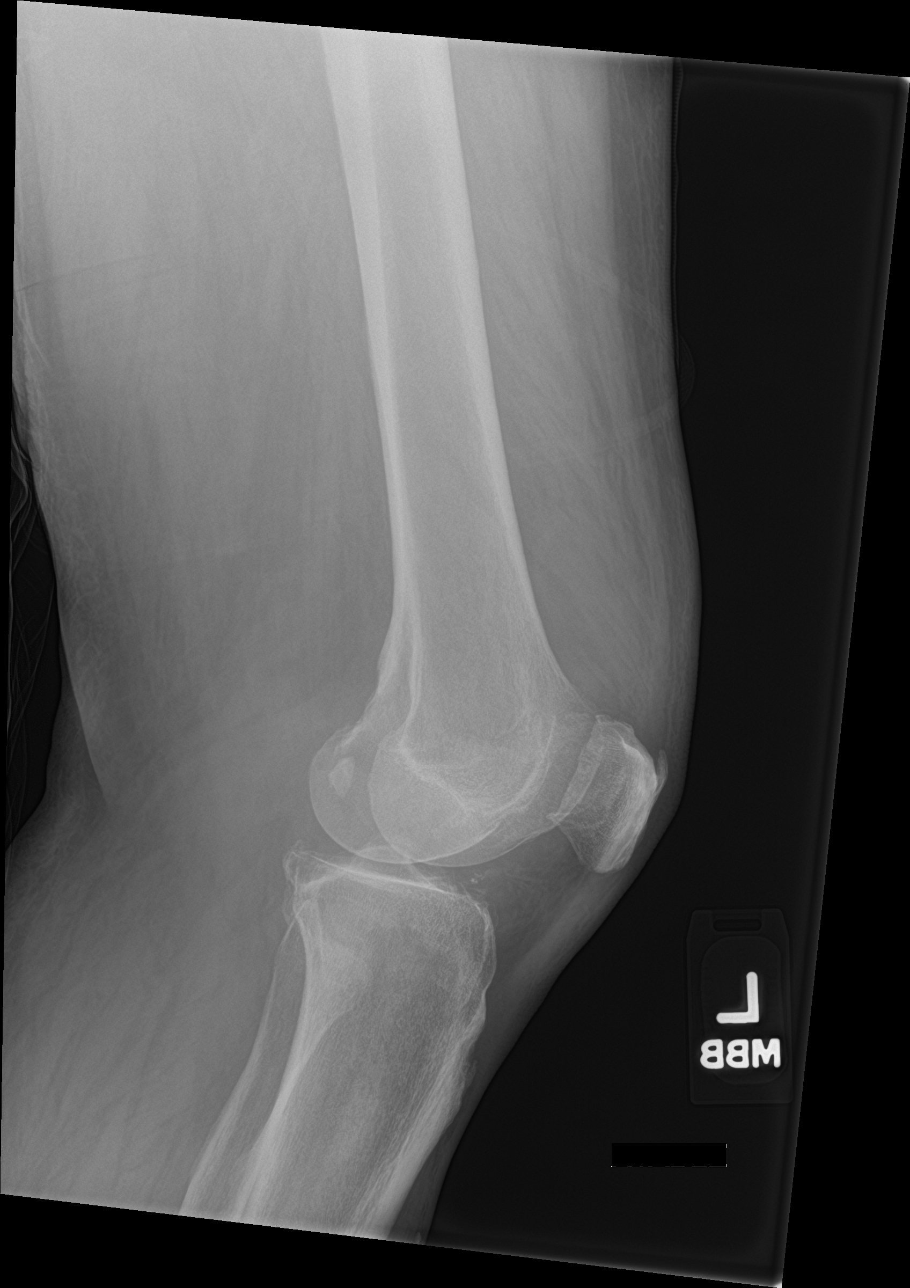

[knee obl (1 of 2)]
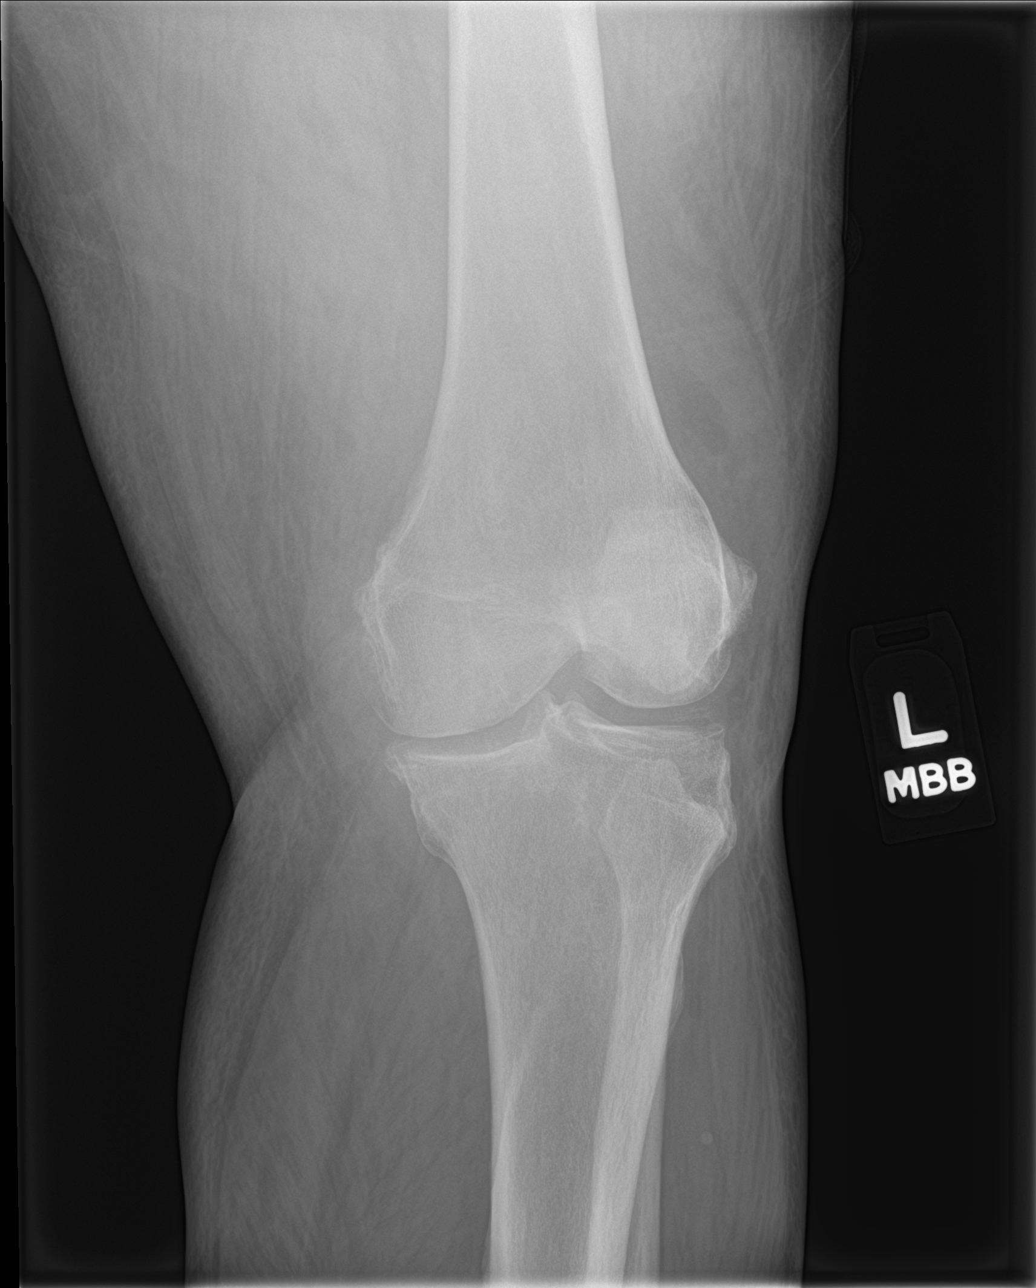

[knee obl (2 of 2)]
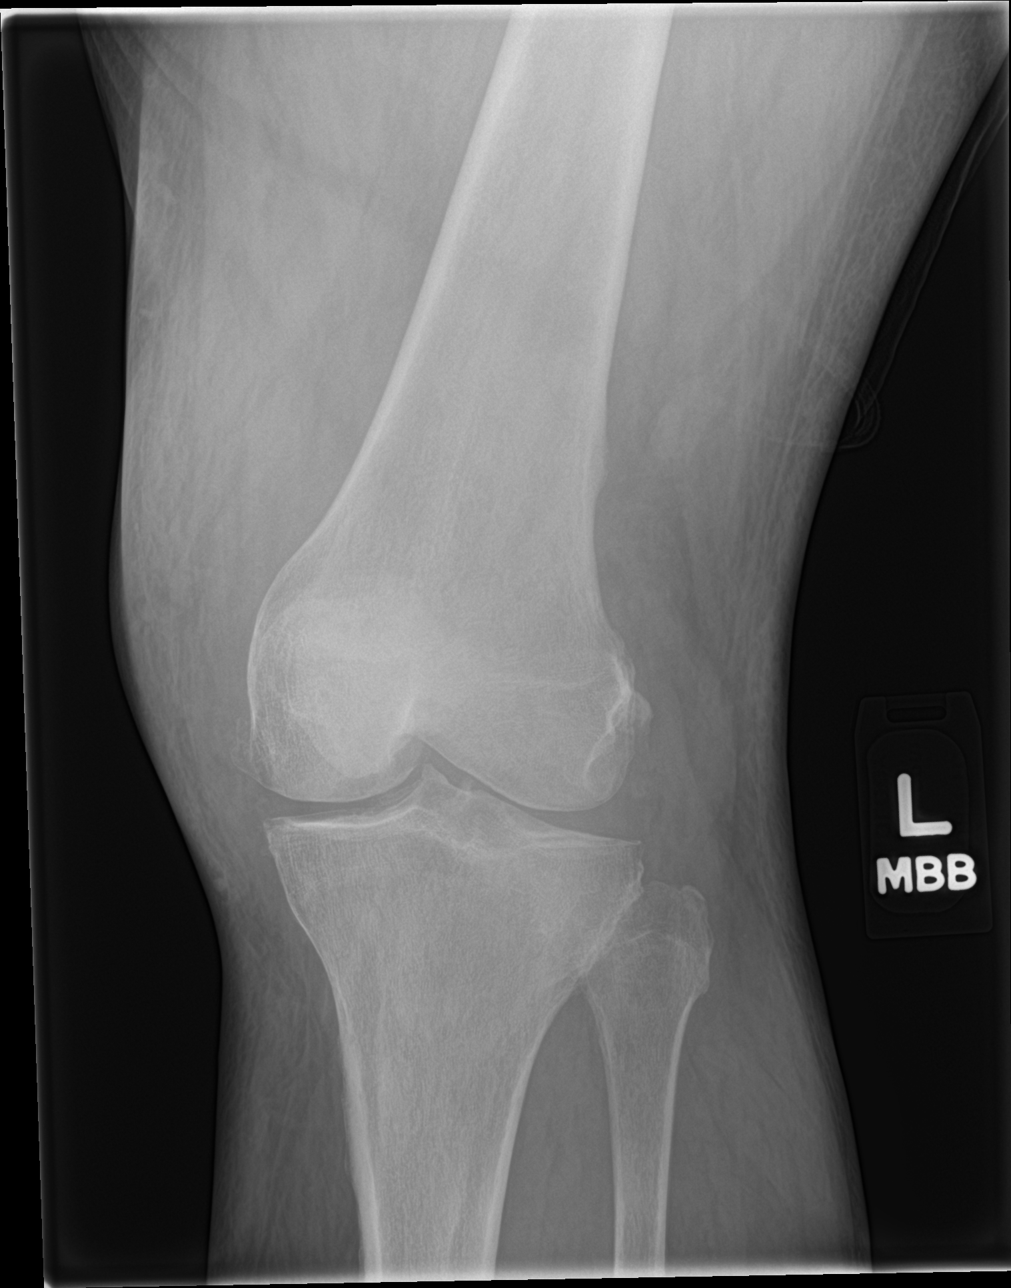

[4 of 4 positions shown; findings below may reference images not displayed]

FINDINGS: There are degenerative changes primarily involving the MEDIAL
patellofemoral compartments. No acute fracture or subluxation.
Moderate joint effusion is present.
IMPRESSION: Degenerative changes.  Moderate joint effusion.

## 2020-02-13 DIAGNOSIS — S62524A Nondisplaced fracture of distal phalanx of right thumb, initial encounter for closed fracture: Secondary | ICD-10-CM | POA: Diagnosis not present

## 2020-02-14 DIAGNOSIS — S62523A Displaced fracture of distal phalanx of unspecified thumb, initial encounter for closed fracture: Secondary | ICD-10-CM | POA: Diagnosis not present

## 2020-02-28 IMAGING — CT CT ABD-PELV W/ CM
2 of 5 series · 16 of 46 positions shown, 18 images · IV contrast (iopamidol)
Comparison: 09/11/2017

CLINICAL DATA: Abdominal pain and distension for 6 months. History
of multiple prior hernia repair.

EXAM:
CT ABDOMEN AND PELVIS WITH CONTRAST
TECHNIQUE: Multidetector CT imaging of the abdomen and pelvis was performed
using the standard protocol following bolus administration of
intravenous contrast.
CONTRAST:  100mL [id number] IOPAMIDOL ([id number]-622) INJECTION 61%

[Series 2: axial st · axial · 0.98mm/px · z∈[-519,-79]mm · 13 of 100 slices shown, 15 images]
[im 6/100  soft-tissue]
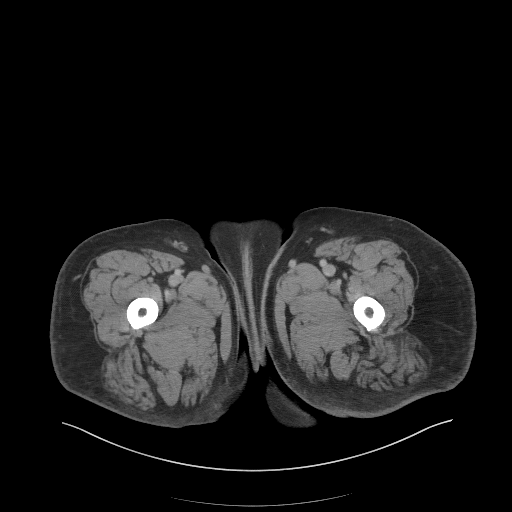
[im 6/100  bone]
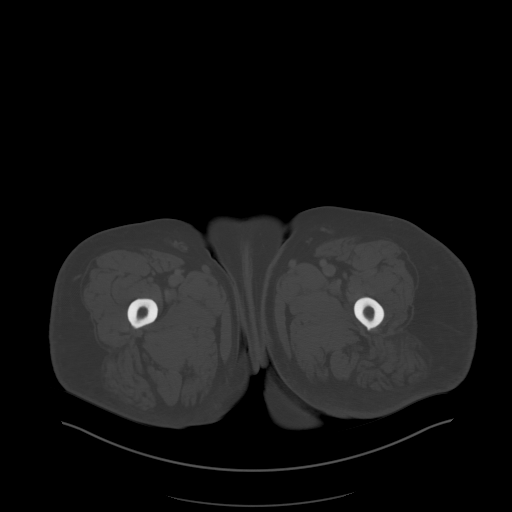
[im 16/100  soft-tissue]
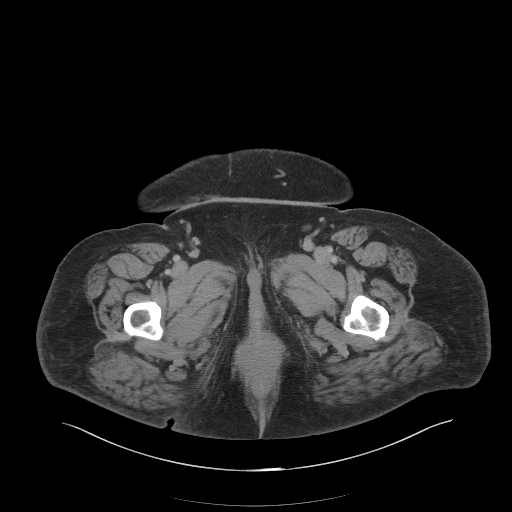
[im 21/100  soft-tissue]
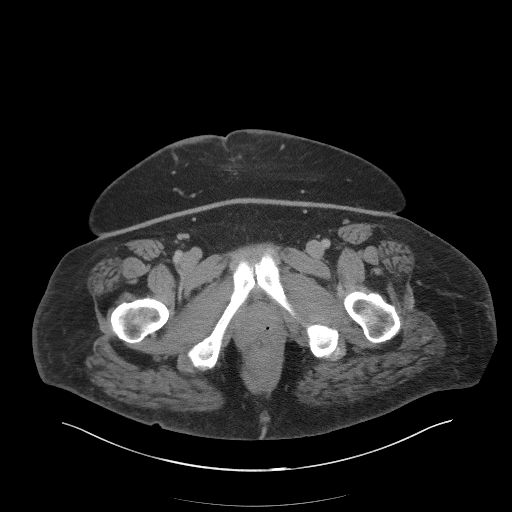
[im 27/100  soft-tissue]
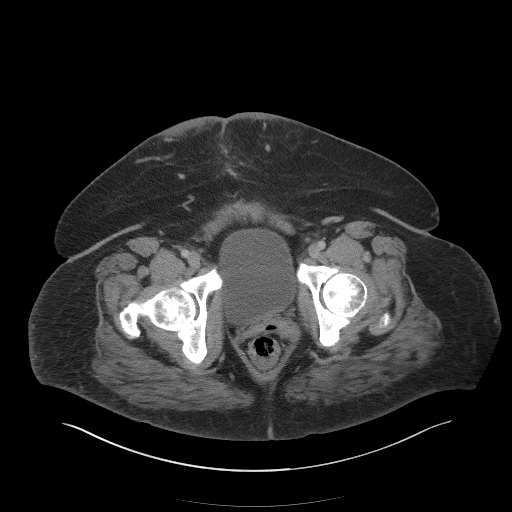
[im 37/100  soft-tissue]
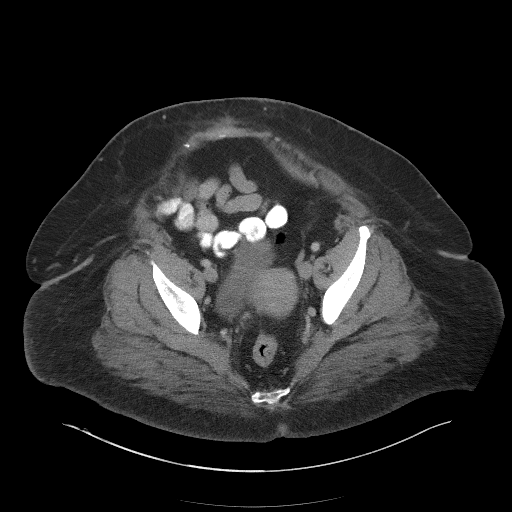
[im 42/100  soft-tissue]
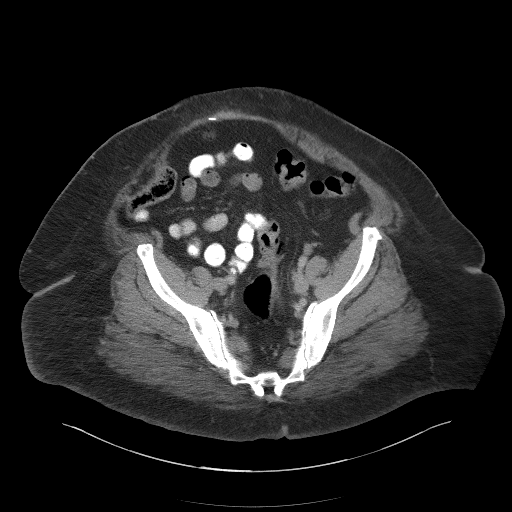
[im 53/100  soft-tissue]
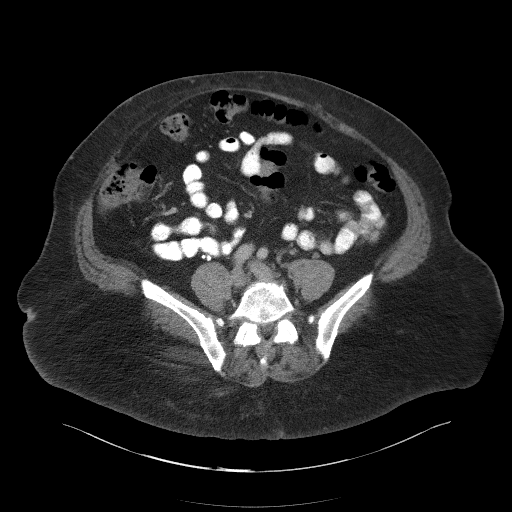
[im 58/100  soft-tissue]
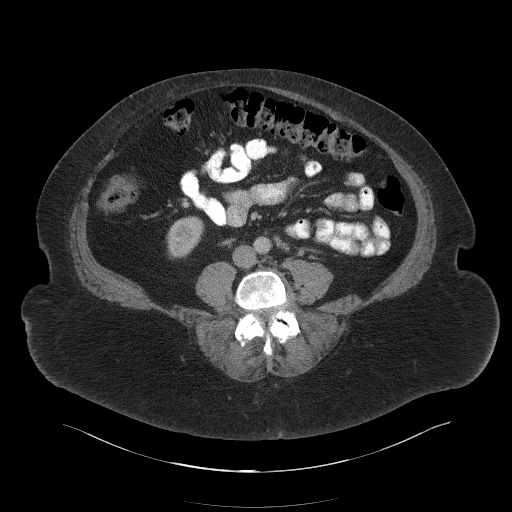
[im 63/100  soft-tissue]
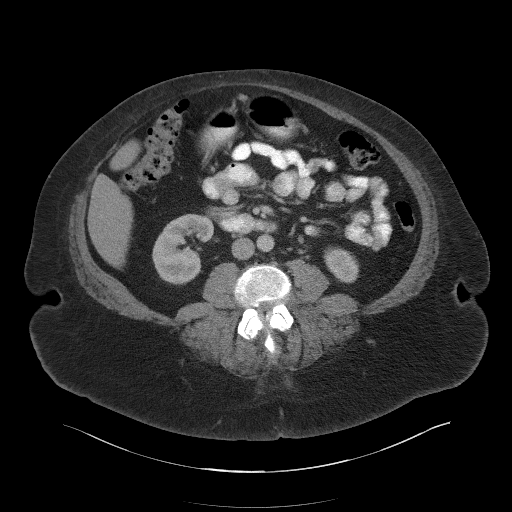
[im 63/100  bone]
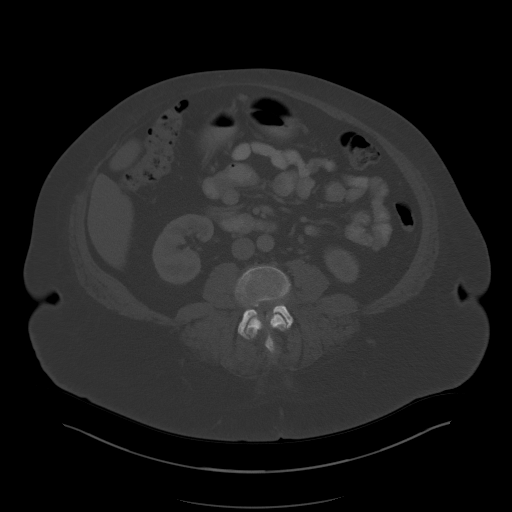
[im 73/100  soft-tissue]
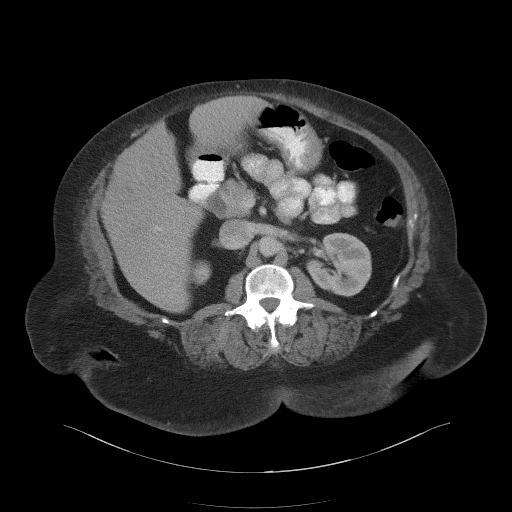
[im 79/100  soft-tissue]
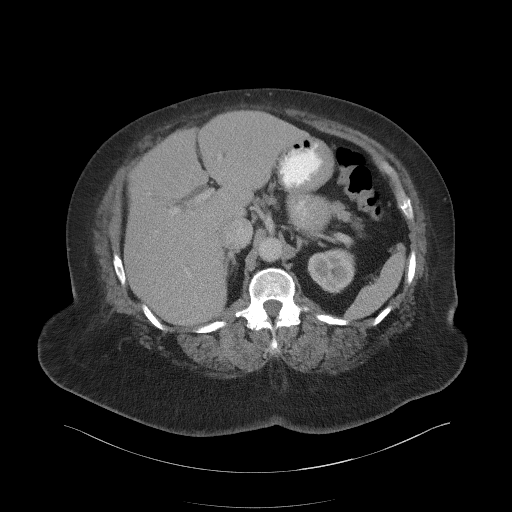
[im 84/100  soft-tissue]
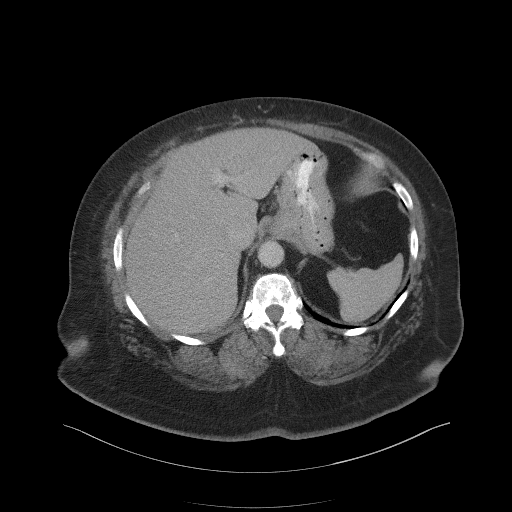
[im 94/100  soft-tissue]
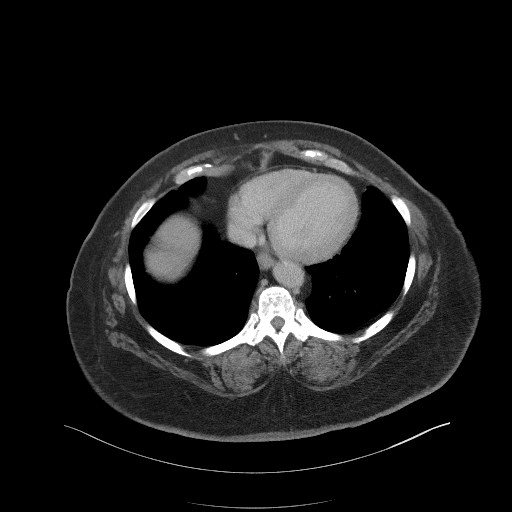

[Series 5: coronal st · coronal · 0.92mm/px · 3 of 122 slices shown]
[im 41/122  soft-tissue]
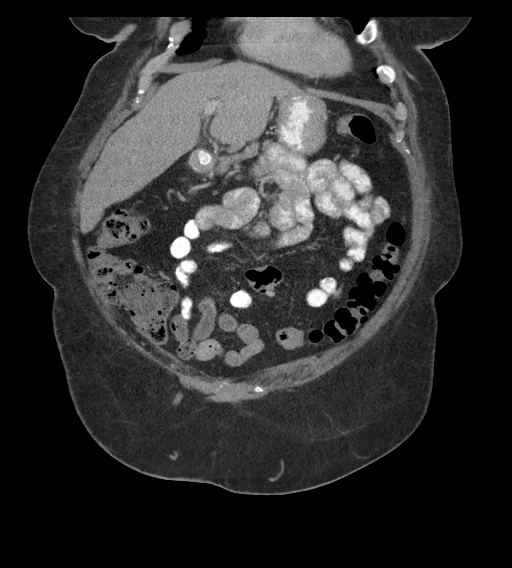
[im 54/122  soft-tissue]
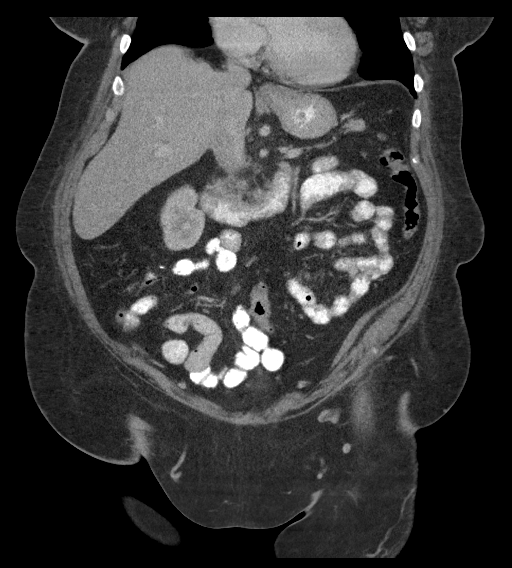
[im 68/122  soft-tissue]
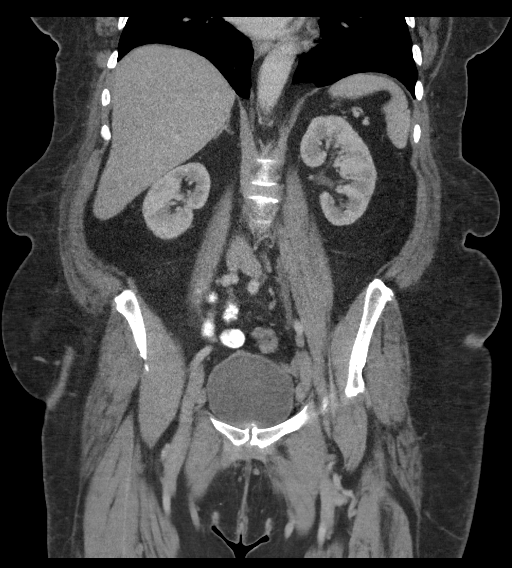

[16 of 46 positions shown; findings below may reference images not displayed]

FINDINGS: Lower chest: Unremarkable.

Hepatobiliary: No focal abnormality within the liver parenchyma.
Gallbladder surgically absent. Mild extrahepatic biliary duct
prominence is stable since prior.

Pancreas: No focal mass lesion. No dilatation of the main duct. No
intraparenchymal cyst. No peripancreatic edema.

Spleen: No splenomegaly. No focal mass lesion.

Adrenals/Urinary Tract: No adrenal nodule or mass. Kidneys
unremarkable.

Stomach/Bowel: Tiny hiatal hernia. Stomach otherwise unremarkable.
Duodenum is normally positioned as is the ligament of Treitz. No
small bowel wall thickening. No small bowel dilatation. The terminal
ileum is normal. The appendix is normal. No gross colonic mass. No
colonic wall thickening. No substantial diverticular change.

Vascular/Lymphatic: No abdominal aortic aneurysm. There is no
gastrohepatic or hepatoduodenal ligament lymphadenopathy. No
intraperitoneal or retroperitoneal lymphadenopathy. No pelvic
sidewall lymphadenopathy. Upper normal lymph nodes in the common
femoral regions are stable in the interval.

Reproductive: Uterus unremarkable.  There is no adnexal mass.

Other: No intraperitoneal free fluid.

Musculoskeletal: Patient is status post ventral mesh placement. No
evidence for recurrent hernia although diffuse abdominal wall
fascial laxity is again noted. No worrisome lytic or sclerotic
osseous abnormality.
IMPRESSION: 1. No acute findings in the abdomen or pelvis. No evidence to
explain the patient's history of abdominal pain.
2. Ventral mesh evident with diffuse anterior abdominal wall fascial
laxity. No evidence for recurrent hernia.
3. Status post cholecystectomy with stable mild extrahepatic biliary
prominence, likely related to the gallbladder removal.

## 2020-03-05 DIAGNOSIS — I10 Essential (primary) hypertension: Secondary | ICD-10-CM | POA: Diagnosis not present

## 2020-03-05 DIAGNOSIS — E785 Hyperlipidemia, unspecified: Secondary | ICD-10-CM | POA: Diagnosis not present

## 2020-03-05 DIAGNOSIS — I2510 Atherosclerotic heart disease of native coronary artery without angina pectoris: Secondary | ICD-10-CM | POA: Diagnosis not present

## 2020-03-05 DIAGNOSIS — E119 Type 2 diabetes mellitus without complications: Secondary | ICD-10-CM | POA: Diagnosis not present

## 2020-03-05 DIAGNOSIS — E559 Vitamin D deficiency, unspecified: Secondary | ICD-10-CM | POA: Diagnosis not present

## 2020-03-06 DIAGNOSIS — E119 Type 2 diabetes mellitus without complications: Secondary | ICD-10-CM | POA: Diagnosis not present

## 2020-03-06 DIAGNOSIS — E559 Vitamin D deficiency, unspecified: Secondary | ICD-10-CM | POA: Diagnosis not present

## 2020-03-06 DIAGNOSIS — Z79899 Other long term (current) drug therapy: Secondary | ICD-10-CM | POA: Diagnosis not present

## 2020-03-06 DIAGNOSIS — E785 Hyperlipidemia, unspecified: Secondary | ICD-10-CM | POA: Diagnosis not present

## 2020-03-06 DIAGNOSIS — E039 Hypothyroidism, unspecified: Secondary | ICD-10-CM | POA: Diagnosis not present

## 2020-03-31 DIAGNOSIS — E1142 Type 2 diabetes mellitus with diabetic polyneuropathy: Secondary | ICD-10-CM | POA: Diagnosis not present

## 2020-03-31 DIAGNOSIS — E782 Mixed hyperlipidemia: Secondary | ICD-10-CM | POA: Diagnosis not present

## 2020-03-31 DIAGNOSIS — E039 Hypothyroidism, unspecified: Secondary | ICD-10-CM | POA: Diagnosis not present

## 2020-03-31 DIAGNOSIS — Z794 Long term (current) use of insulin: Secondary | ICD-10-CM | POA: Diagnosis not present

## 2020-03-31 HISTORY — DX: Morbid (severe) obesity due to excess calories: E66.01

## 2020-04-07 DIAGNOSIS — I10 Essential (primary) hypertension: Secondary | ICD-10-CM | POA: Diagnosis not present

## 2020-04-07 DIAGNOSIS — M25512 Pain in left shoulder: Secondary | ICD-10-CM | POA: Diagnosis not present

## 2020-04-07 DIAGNOSIS — R600 Localized edema: Secondary | ICD-10-CM | POA: Diagnosis not present

## 2020-04-08 DIAGNOSIS — M19012 Primary osteoarthritis, left shoulder: Secondary | ICD-10-CM | POA: Diagnosis not present

## 2020-04-08 DIAGNOSIS — M25512 Pain in left shoulder: Secondary | ICD-10-CM | POA: Diagnosis not present

## 2020-04-30 DIAGNOSIS — D329 Benign neoplasm of meninges, unspecified: Secondary | ICD-10-CM | POA: Diagnosis not present

## 2020-04-30 DIAGNOSIS — I10 Essential (primary) hypertension: Secondary | ICD-10-CM | POA: Diagnosis not present

## 2020-04-30 DIAGNOSIS — M47812 Spondylosis without myelopathy or radiculopathy, cervical region: Secondary | ICD-10-CM | POA: Diagnosis not present

## 2020-06-17 ENCOUNTER — Telehealth: Payer: Self-pay | Admitting: Cardiology

## 2020-06-17 NOTE — Telephone Encounter (Signed)
   Primary Cardiologist: No primary care provider on file.  Chart reviewed as part of pre-operative protocol coverage. Because of Michelle Harding past medical history and time since last visit, she will require a follow-up visit in order to better assess preoperative cardiovascular risk.  Pre-op covering staff: - Please schedule appointment and call patient to inform them. If patient already had an upcoming appointment within acceptable timeframe, please add "pre-op clearance" to the appointment notes so provider is aware. - Please contact requesting surgeon's office via preferred method (i.e, phone, fax) to inform them of need for appointment prior to surgery.  If applicable, this message will also be routed to pharmacy pool and/or primary cardiologist for input on holding anticoagulant/antiplatelet agent as requested below so that this information is available to the clearing provider at time of patient's appointment.   Michelle Lin Jessamyn Watterson, PA  06/17/2020, 12:01 PM

## 2020-06-17 NOTE — Telephone Encounter (Signed)
Patient returned call to schedule pre-op/6 month follow-up appointment. I made her aware that Dr. Agustin Cree does not have availability for the next 2 months at both  and [employer] [facility] and she requested to be worked in [employer], so she can proceed with scheduling procedure. Is the patient able to be worked in anywhere?  Please advise.

## 2020-06-17 NOTE — Telephone Encounter (Signed)
Looks like primary card is Dr. Agustin Cree in our [place] office. I reviewed the scheduled for [place], though I will ask [place] office to help with appt to see where they can squeeze pt in for pre op clearance.

## 2020-06-17 NOTE — Telephone Encounter (Signed)
Called patient to schedule appt for pre-op/6 month recall. No answer/no voicemail to leave message.

## 2020-06-17 NOTE — Telephone Encounter (Signed)
   [facility] Pre-operative Risk Assessment    Request for surgical clearance:  1. What type of surgery is being performed? Rotator Cuff Repair (open)  2. When is this surgery scheduled? TBD  3. What type of clearance is required (medical clearance vs. Pharmacy clearance to hold med vs. Both)? Medical Clearance   4. Are there any medications that need to be held prior to surgery and how long? Their office is inquiring about whether or not the patient needs to hold any medications.  5. Practice name and name of physician performing surgery? [facility]  Dr. Donivan Scull    6. What is your office phone number? [phone] (ext: [phone])    7.   What is your office fax number? [phone]  8.   Anesthesia type (None, local, MAC, general) ? General with a nerve block   Zara Council 06/17/2020, 10:22 AM  _________________________________________________________________

## 2020-06-17 NOTE — Telephone Encounter (Signed)
Message sent to [facility] schedulers for them to schedule patient.

## 2020-06-17 NOTE — Telephone Encounter (Signed)
Patient was returning phone call. Please call back 

## 2020-06-19 ENCOUNTER — Other Ambulatory Visit: Payer: Self-pay

## 2020-06-19 DIAGNOSIS — K589 Irritable bowel syndrome without diarrhea: Secondary | ICD-10-CM | POA: Insufficient documentation

## 2020-06-19 DIAGNOSIS — D329 Benign neoplasm of meninges, unspecified: Secondary | ICD-10-CM | POA: Insufficient documentation

## 2020-06-19 DIAGNOSIS — I519 Heart disease, unspecified: Secondary | ICD-10-CM | POA: Insufficient documentation

## 2020-06-19 DIAGNOSIS — K5909 Other constipation: Secondary | ICD-10-CM | POA: Insufficient documentation

## 2020-06-19 DIAGNOSIS — N83209 Unspecified ovarian cyst, unspecified side: Secondary | ICD-10-CM | POA: Insufficient documentation

## 2020-06-19 DIAGNOSIS — J449 Chronic obstructive pulmonary disease, unspecified: Secondary | ICD-10-CM | POA: Insufficient documentation

## 2020-06-19 DIAGNOSIS — K649 Unspecified hemorrhoids: Secondary | ICD-10-CM | POA: Insufficient documentation

## 2020-06-19 DIAGNOSIS — M1990 Unspecified osteoarthritis, unspecified site: Secondary | ICD-10-CM | POA: Insufficient documentation

## 2020-06-19 NOTE — Telephone Encounter (Signed)
Pt is scheduled to see Dr. Agustin Cree 07/01/20 for pre op clearance . I will forward clearance notes to MD for upcoming appt. Will send FYI to requesting office pt has appt 07/01/20.

## 2020-06-24 ENCOUNTER — Telehealth: Payer: Self-pay | Admitting: Oncology

## 2020-06-24 NOTE — Telephone Encounter (Signed)
Per Dr Bobby Rumpf in [facility] Dec, requested patient to have Skeletal Survey before making an appt here.  Relayed request through [facility].  Received referral request again today.  Pulled MRI Shoulder/X-Ray Shoulder results.  Per Dr Bobby Rumpf, these Imaging results does not bring a cause for patient to be seen here  Informed Romie Minus - Dr Richardean Chimera Office

## 2020-07-01 ENCOUNTER — Ambulatory Visit (INDEPENDENT_AMBULATORY_CARE_PROVIDER_SITE_OTHER): Payer: Medicare (Managed Care) | Admitting: Cardiology

## 2020-07-01 ENCOUNTER — Other Ambulatory Visit: Payer: Self-pay

## 2020-07-01 VITALS — BP 152/92 | HR 81 | Ht 66.5 in | Wt 254.0 lb

## 2020-07-01 DIAGNOSIS — Z01810 Encounter for preprocedural cardiovascular examination: Secondary | ICD-10-CM | POA: Insufficient documentation

## 2020-07-01 DIAGNOSIS — I2510 Atherosclerotic heart disease of native coronary artery without angina pectoris: Secondary | ICD-10-CM

## 2020-07-01 DIAGNOSIS — E1142 Type 2 diabetes mellitus with diabetic polyneuropathy: Secondary | ICD-10-CM | POA: Diagnosis not present

## 2020-07-01 DIAGNOSIS — Z794 Long term (current) use of insulin: Secondary | ICD-10-CM

## 2020-07-01 DIAGNOSIS — E782 Mixed hyperlipidemia: Secondary | ICD-10-CM | POA: Diagnosis not present

## 2020-07-01 HISTORY — DX: Encounter for preprocedural cardiovascular examination: Z01.810

## 2020-07-01 MED ORDER — TORSEMIDE 5 MG PO TABS: 5.0000 mg | ORAL_TABLET | Freq: Every day | ORAL | 1 refills | Status: DC

## 2020-07-01 NOTE — Patient Instructions (Signed)
Medication Instructions:  Your physician has recommended you make the following change in your medication:   START: Demadex 5 mg daily   *If you need a refill on your cardiac medications before your next appointment, please call your pharmacy*   Lab Work: Your physician recommends that you return for lab work in 1 week: bmp   If you have labs (blood work) drawn today and your tests are completely normal, you will receive your results only by: Marland Kitchen MyChart Message (if you have MyChart) OR . A paper copy in the mail If you have any lab test that is abnormal or we need to change your treatment, we will call you to review the results.   Testing/Procedures: None   Follow-Up: At [facility], you and your health needs are our priority.  As part of our continuing mission to provide you with exceptional heart care, we have created designated Provider Care Teams.  These Care Teams include your primary Cardiologist (physician) and Advanced Practice Providers (APPs -  Physician Assistants and Nurse Practitioners) who all work together to provide you with the care you need, when you need it.  We recommend signing up for the patient portal called "MyChart".  Sign up information is provided on this After Visit Summary.  MyChart is used to connect with patients for Virtual Visits (Telemedicine).  Patients are able to view lab/test results, encounter notes, upcoming appointments, etc.  Non-urgent messages can be sent to your provider as well.   To learn more about what you can do with MyChart, go to [email or web address].    Your next appointment:   5 month(s)  The format for your next appointment:   In Person  Provider:   Jenne Campus, MD   Other Instructions

## 2020-07-01 NOTE — Addendum Note (Signed)
Addended by: Senaida Ores on: 07/01/2020 03:50 PM   Modules accepted: Orders

## 2020-07-01 NOTE — Progress Notes (Signed)
Cardiology Office Note:    Date:  07/01/2020   ID:  [name], DOB [birth year], MRN [id number]  PCP:  Nicholos Johns, MD  Cardiologist:  Jenne Campus, MD    Referring MD: Nicholos Johns, MD   Chief Complaint  Patient presents with  . Medical Clearance    History of Present Illness:    Michelle Harding is a 70 y.o. female with past medical history significant for coronary artery disease, cardiac catheterization done in 2013 however showed only luminal disease.  She did have a stress test done in the middle of 2012 which showed no evidence of ischemia she also got history of multiple risk factors for coronary artery disease namely hypertension, dyslipidemia, diabetes.  She comes today to my office because she is contemplating having shoulder surgery.  She would like to make sure it safe from heart point of view to do this and for risks assessment before the procedure.  Overall she says she is doing quite well she is able to do things walk around she goes to her son who got 1 flight of stairs she can go up and down with no difficulties.  Denies having any chest pain tightness squeezing pressure burning chest when she does that.  Past Medical History:  Diagnosis Date  . Aortic regurgitation 08/06/2015  . Asthma   . Bradycardia by electrocardiogram 02/10/2012  . Chronic constipation   . Community acquired pneumonia 02/07/2012  . COPD (chronic obstructive pulmonary disease) ([place])   . Coronary artery disease involving native coronary artery of native heart without angina pectoris 04/24/2015   Luminal disease by cardiac catheterization from 2013  . Diabetes mellitus ([place]) 02/07/2012  . Diabetes, polyneuropathy ([place]) 08/06/2015  . Dyslipidemia 04/24/2015  . Gout attack 02/07/2012  . Heart disease   . Hemorrhoids   . Herniated nucleus pulposus, L5-S1, right 08/06/2015   Formatting of this note might be different from the original. Right L5 and S1 roots.  L4-5, possible left L4 root  .  Hyperlipidemia 08/06/2015  . Hypertension   . Hypothyroidism 08/06/2015  . IBS (irritable bowel syndrome)   . Lumbar radiculopathy 08/06/2015   Formatting of this note might be different from the original. Right  . Meningioma ([place])   . Morbid obesity ([place]) 03/31/2020  . Osteoarthritis   . Osteoarthritis   . Ovarian cyst   . Type 2 diabetes mellitus with diabetic polyneuropathy, with long-term current use of insulin ([place]) 08/06/2015    Past Surgical History:  Procedure Laterality Date  . CATARACT EXTRACTION Left 03/2013  . CATARACT EXTRACTION Right 05/2013  . CHOLECYSTECTOMY  1983  . COLONOSCOPY  04/27/2012   Colonic polyp status post polypectomy. Minimal sigmoid diverticulosis. Small internal hemorrhoids.   . ESOPHAGOGASTRODUODENOSCOPY  06/24/2010   Small hiatal hernia  . HAND SURGERY    . HERNIA REPAIR     With mesh  . KNEE SURGERY  2012  . OOPHORECTOMY Left 07/10/2015  . OOPHORECTOMY Right 1989  . ROTATOR CUFF REPAIR    . TONSILLECTOMY    . TONSILLECTOMY AND ADENOIDECTOMY      Current Medications: Current Meds  Medication Sig  . albuterol (PROVENTIL HFA;VENTOLIN HFA) 108 (90 BASE) MCG/ACT inhaler Inhale 2 puffs into the lungs every 6 (six) hours as needed. For shortness of breath  . allopurinol (ZYLOPRIM) 300 MG tablet Take 300 mg by mouth as needed (arthritis).  . Cholecalciferol (VITAMIN D3) 1.25 MG (50000 UT) CAPS Take 1 capsule by mouth once a week.  Marland Kitchen  colchicine 0.6 MG tablet Take 0.6 mg by mouth as needed (Gout).  . dapagliflozin propanediol (FARXIGA) 10 MG TABS tablet Take 10 mg by mouth daily.   . Flaxseed, Linseed, (GNP FLAX SEED OIL) 1000 MG CAPS Take 1,000 mg by mouth daily.  Marland Kitchen levothyroxine (SYNTHROID, LEVOTHROID) 25 MCG tablet Take 50 mcg by mouth every other day.  . nitroGLYCERIN (NITROSTAT) 0.4 MG SL tablet Place 1 tablet under the tongue as needed for chest pain.  Marland Kitchen omeprazole (PRILOSEC) 20 MG capsule Take 20 mg by mouth daily.  . predniSONE (DELTASONE) 5  MG tablet Take 5 mg by mouth as needed (Arthritis).  Marland Kitchen REPATHA SURECLICK 101 MG/ML SOAJ Inject as directed every 14 (fourteen) days.  Marland Kitchen sertraline (ZOLOFT) 100 MG tablet Take 100 mg by mouth daily.  Marland Kitchen topiramate (TOPAMAX) 25 MG tablet Take 25 mg by mouth 2 (two) times daily.  Nelva Nay SOLOSTAR 300 UNIT/ML Solostar Pen daily.  . TRULICITY 1.5 BP/1.0CH SOPN Inject into the skin once a week.  . valsartan-hydrochlorothiazide (DIOVAN-HCT) 320-25 MG tablet Take 1 tablet by mouth daily.  . Vitamin D, Ergocalciferol, (DRISDOL) 1.25 MG (50000 UNIT) CAPS capsule Take 50,000 Units by mouth once a week.  . vitamin E 1000 UNIT capsule Take 1,000 Units by mouth daily.     Allergies:   Statins, Furosemide, Rosuvastatin, Aspirin, and Vicodin [hydrocodone-acetaminophen]   Social History   Socioeconomic History  . Marital status: Divorced    Spouse name: Not on file  . Number of children: 7  . Years of education: Not on file  . Highest education level: Not on file  Occupational History  . Occupation: Retired   Tobacco Use  . Smoking status: Never Smoker  . Smokeless tobacco: Never Used  . Tobacco comment: did smoke from time to time but stopped 2018  Vaping Use  . Vaping Use: Never used  Substance and Sexual Activity  . Alcohol use: Yes    Comment: ocassionally  . Drug use: Not Currently    Types: Marijuana    Comment: use was in the past   . Sexual activity: Never  Other Topics Concern  . Not on file  Social History Narrative  . Not on file   Social Determinants of Health   Financial Resource Strain: Not on file  Food Insecurity: Not on file  Transportation Needs: Not on file  Physical Activity: Not on file  Stress: Not on file  Social Connections: Not on file     Family History: The patient's family history includes Diabetes in her paternal grandmother; Diabetes type II in her father and son; Hypertension in her daughter and paternal grandmother. There is no history of Colon  cancer, Esophageal cancer, or Breast cancer. ROS:   Please see the history of present illness.    All 14 point review of systems negative except as described per history of present illness  EKGs/Labs/Other Studies Reviewed:      Recent Labs: No results found for requested labs within last 8760 hours.  Recent Lipid Panel No results found for: CHOL, TRIG, HDL, CHOLHDL, VLDL, LDLCALC, LDLDIRECT  Physical Exam:    VS:  BP (!) 152/92 (BP Location: Left Arm, Patient Position: Sitting)   Pulse 81   Ht 5' 6.5" (1.689 m)   Wt 254 lb (115.2 kg)   SpO2 92%   BMI 40.38 kg/m     Wt Readings from Last 3 Encounters:  07/01/20 254 lb (115.2 kg)  09/10/19 256 lb 9.6 oz (116.4  kg)  02/21/19 250 lb (113.4 kg)     GEN:  Well nourished, well developed in no acute distress HEENT: Normal NECK: No JVD; No carotid bruits LYMPHATICS: No lymphadenopathy CARDIAC: RRR, no murmurs, no rubs, no gallops RESPIRATORY:  Clear to auscultation without rales, wheezing or rhonchi  ABDOMEN: Soft, non-tender, non-distended MUSCULOSKELETAL:  No edema; No deformity  SKIN: Warm and dry LOWER EXTREMITIES: no swelling NEUROLOGIC:  Alert and oriented x 3 PSYCHIATRIC:  Normal affect   ASSESSMENT:    1. Coronary artery disease involving native coronary artery of native heart without angina pectoris   2. Preop cardiovascular exam   3. Type 2 diabetes mellitus with diabetic polyneuropathy, with long-term current use of insulin ([place])   4. Mixed hyperlipidemia    PLAN:    In order of problems listed above:  1. Coronary artery disease only luminal for cardiac cath in 2013, last stress testing 2020 -, on top of that she does not have any symptoms that would suggest activation of the problem.  From that point of view I think we should be able to proceed with surgery as scheduled.  She does have sufficient exercise tolerance. 2. Type 2 diabetes that being followed by internal medicine team, I did review her K PN which  show me her hemoglobin A1c of 7.6.  I told her she need to try to better control her diabetes. 3. Dyslipidemia her LDL is 35 HDL 53 we will continue present management which includes Repatha. 4. She is complaining of having some swelling of lower extremities everything time on top of that her blood pressure is uncontrolled today.  Her Chem-7 was fine she is already taking hydrochlorothiazide 25 mg daily.  I will add 20 mg of Lasix to her medical regimen, will check Chem-7 within a week.   Medication Adjustments/Labs and Tests Ordered: Current medicines are reviewed at length with the patient today.  Concerns regarding medicines are outlined above.  No orders of the defined types were placed in this encounter.  Medication changes: No orders of the defined types were placed in this encounter.   Signed, Park Liter, MD, [facility] 07/01/2020 3:33 PM    [place]

## 2020-07-11 NOTE — Telephone Encounter (Signed)
   Primary Cardiologist: Jenne Campus, MD  Chart reviewed as part of pre-operative protocol coverage. Given past medical history and time since last visit, based on ACC/AHA guidelines, Michelle Harding would be at acceptable risk for the planned procedure without further cardiovascular testing.   The patient was advised that if she develops new symptoms prior to surgery to contact our office to arrange for a follow-up visit, and she verbalized understanding.  I will route this recommendation to the requesting party via Epic fax function and remove from pre-op pool.  Please call with questions.  Loel Dubonnet, NP 07/11/2020, 1:44 PM

## 2020-07-11 NOTE — Telephone Encounter (Signed)
[facility] calling to check on status of surgical clearance. Please update

## 2020-07-17 DIAGNOSIS — Z79899 Other long term (current) drug therapy: Secondary | ICD-10-CM | POA: Diagnosis not present

## 2020-07-22 DIAGNOSIS — M7540 Impingement syndrome of unspecified shoulder: Secondary | ICD-10-CM | POA: Diagnosis not present

## 2020-07-22 DIAGNOSIS — Z1152 Encounter for screening for COVID-19: Secondary | ICD-10-CM | POA: Diagnosis not present

## 2020-07-22 DIAGNOSIS — Z1159 Encounter for screening for other viral diseases: Secondary | ICD-10-CM | POA: Diagnosis not present

## 2020-07-28 DIAGNOSIS — E1142 Type 2 diabetes mellitus with diabetic polyneuropathy: Secondary | ICD-10-CM | POA: Diagnosis not present

## 2020-07-28 DIAGNOSIS — Z794 Long term (current) use of insulin: Secondary | ICD-10-CM | POA: Diagnosis not present

## 2020-07-28 DIAGNOSIS — M75122 Complete rotator cuff tear or rupture of left shoulder, not specified as traumatic: Secondary | ICD-10-CM | POA: Diagnosis not present

## 2020-07-28 DIAGNOSIS — Z7951 Long term (current) use of inhaled steroids: Secondary | ICD-10-CM | POA: Diagnosis not present

## 2020-07-28 DIAGNOSIS — M12811 Other specific arthropathies, not elsewhere classified, right shoulder: Secondary | ICD-10-CM | POA: Diagnosis not present

## 2020-07-28 DIAGNOSIS — Z79899 Other long term (current) drug therapy: Secondary | ICD-10-CM | POA: Diagnosis not present

## 2020-07-28 DIAGNOSIS — E119 Type 2 diabetes mellitus without complications: Secondary | ICD-10-CM | POA: Diagnosis not present

## 2020-07-28 DIAGNOSIS — Z8639 Personal history of other endocrine, nutritional and metabolic disease: Secondary | ICD-10-CM | POA: Diagnosis not present

## 2020-07-28 DIAGNOSIS — M19012 Primary osteoarthritis, left shoulder: Secondary | ICD-10-CM | POA: Diagnosis not present

## 2020-07-28 DIAGNOSIS — J45909 Unspecified asthma, uncomplicated: Secondary | ICD-10-CM | POA: Diagnosis not present

## 2020-07-28 DIAGNOSIS — M069 Rheumatoid arthritis, unspecified: Secondary | ICD-10-CM | POA: Diagnosis not present

## 2020-07-28 DIAGNOSIS — G8918 Other acute postprocedural pain: Secondary | ICD-10-CM | POA: Diagnosis not present

## 2020-07-28 DIAGNOSIS — M75120 Complete rotator cuff tear or rupture of unspecified shoulder, not specified as traumatic: Secondary | ICD-10-CM | POA: Diagnosis not present

## 2020-07-28 DIAGNOSIS — M109 Gout, unspecified: Secondary | ICD-10-CM | POA: Diagnosis not present

## 2020-07-28 DIAGNOSIS — M7540 Impingement syndrome of unspecified shoulder: Secondary | ICD-10-CM | POA: Diagnosis not present

## 2020-07-28 DIAGNOSIS — E039 Hypothyroidism, unspecified: Secondary | ICD-10-CM | POA: Diagnosis not present

## 2020-07-28 DIAGNOSIS — G894 Chronic pain syndrome: Secondary | ICD-10-CM | POA: Diagnosis not present

## 2020-07-28 DIAGNOSIS — M75102 Unspecified rotator cuff tear or rupture of left shoulder, not specified as traumatic: Secondary | ICD-10-CM | POA: Diagnosis not present

## 2020-07-28 DIAGNOSIS — F17210 Nicotine dependence, cigarettes, uncomplicated: Secondary | ICD-10-CM | POA: Diagnosis not present

## 2020-07-28 DIAGNOSIS — K219 Gastro-esophageal reflux disease without esophagitis: Secondary | ICD-10-CM | POA: Diagnosis not present

## 2020-07-28 DIAGNOSIS — E785 Hyperlipidemia, unspecified: Secondary | ICD-10-CM | POA: Diagnosis not present

## 2020-07-28 DIAGNOSIS — I2510 Atherosclerotic heart disease of native coronary artery without angina pectoris: Secondary | ICD-10-CM | POA: Diagnosis not present

## 2020-07-31 DIAGNOSIS — R293 Abnormal posture: Secondary | ICD-10-CM | POA: Diagnosis not present

## 2020-07-31 DIAGNOSIS — M25612 Stiffness of left shoulder, not elsewhere classified: Secondary | ICD-10-CM | POA: Diagnosis not present

## 2020-07-31 DIAGNOSIS — M25512 Pain in left shoulder: Secondary | ICD-10-CM | POA: Diagnosis not present

## 2020-08-06 DIAGNOSIS — M25512 Pain in left shoulder: Secondary | ICD-10-CM | POA: Diagnosis not present

## 2020-08-06 DIAGNOSIS — M25612 Stiffness of left shoulder, not elsewhere classified: Secondary | ICD-10-CM | POA: Diagnosis not present

## 2020-08-06 DIAGNOSIS — R293 Abnormal posture: Secondary | ICD-10-CM | POA: Diagnosis not present

## 2020-08-08 DIAGNOSIS — M25612 Stiffness of left shoulder, not elsewhere classified: Secondary | ICD-10-CM | POA: Diagnosis not present

## 2020-08-08 DIAGNOSIS — R293 Abnormal posture: Secondary | ICD-10-CM | POA: Diagnosis not present

## 2020-08-08 DIAGNOSIS — M25512 Pain in left shoulder: Secondary | ICD-10-CM | POA: Diagnosis not present

## 2020-08-26 DIAGNOSIS — R293 Abnormal posture: Secondary | ICD-10-CM | POA: Diagnosis not present

## 2020-08-26 DIAGNOSIS — M25612 Stiffness of left shoulder, not elsewhere classified: Secondary | ICD-10-CM | POA: Diagnosis not present

## 2020-08-26 DIAGNOSIS — M25512 Pain in left shoulder: Secondary | ICD-10-CM | POA: Diagnosis not present

## 2020-09-05 DIAGNOSIS — R293 Abnormal posture: Secondary | ICD-10-CM | POA: Diagnosis not present

## 2020-09-05 DIAGNOSIS — M25512 Pain in left shoulder: Secondary | ICD-10-CM | POA: Diagnosis not present

## 2020-09-05 DIAGNOSIS — M25612 Stiffness of left shoulder, not elsewhere classified: Secondary | ICD-10-CM | POA: Diagnosis not present

## 2020-09-24 DIAGNOSIS — I10 Essential (primary) hypertension: Secondary | ICD-10-CM | POA: Diagnosis not present

## 2020-09-24 DIAGNOSIS — E1140 Type 2 diabetes mellitus with diabetic neuropathy, unspecified: Secondary | ICD-10-CM | POA: Diagnosis not present

## 2020-09-24 DIAGNOSIS — R600 Localized edema: Secondary | ICD-10-CM | POA: Diagnosis not present

## 2020-09-24 DIAGNOSIS — J309 Allergic rhinitis, unspecified: Secondary | ICD-10-CM | POA: Diagnosis not present

## 2020-09-24 DIAGNOSIS — E039 Hypothyroidism, unspecified: Secondary | ICD-10-CM | POA: Diagnosis not present

## 2020-09-24 DIAGNOSIS — M25512 Pain in left shoulder: Secondary | ICD-10-CM | POA: Diagnosis not present

## 2020-09-24 DIAGNOSIS — E785 Hyperlipidemia, unspecified: Secondary | ICD-10-CM | POA: Diagnosis not present

## 2020-09-24 DIAGNOSIS — Z8739 Personal history of other diseases of the musculoskeletal system and connective tissue: Secondary | ICD-10-CM | POA: Diagnosis not present

## 2020-09-24 DIAGNOSIS — L989 Disorder of the skin and subcutaneous tissue, unspecified: Secondary | ICD-10-CM | POA: Diagnosis not present

## 2020-09-29 DIAGNOSIS — E782 Mixed hyperlipidemia: Secondary | ICD-10-CM | POA: Diagnosis not present

## 2020-09-29 DIAGNOSIS — E039 Hypothyroidism, unspecified: Secondary | ICD-10-CM | POA: Diagnosis not present

## 2020-09-29 DIAGNOSIS — E1142 Type 2 diabetes mellitus with diabetic polyneuropathy: Secondary | ICD-10-CM | POA: Diagnosis not present

## 2020-09-29 DIAGNOSIS — Z794 Long term (current) use of insulin: Secondary | ICD-10-CM | POA: Diagnosis not present

## 2020-09-29 DIAGNOSIS — I10 Essential (primary) hypertension: Secondary | ICD-10-CM | POA: Diagnosis not present

## 2020-10-16 ENCOUNTER — Telehealth: Payer: Self-pay

## 2020-10-16 DIAGNOSIS — M1711 Unilateral primary osteoarthritis, right knee: Secondary | ICD-10-CM | POA: Diagnosis not present

## 2020-10-16 DIAGNOSIS — M25561 Pain in right knee: Secondary | ICD-10-CM | POA: Diagnosis not present

## 2020-10-16 DIAGNOSIS — M25461 Effusion, right knee: Secondary | ICD-10-CM | POA: Diagnosis not present

## 2020-10-16 DIAGNOSIS — R030 Elevated blood-pressure reading, without diagnosis of hypertension: Secondary | ICD-10-CM | POA: Diagnosis not present

## 2020-10-16 DIAGNOSIS — I10 Essential (primary) hypertension: Secondary | ICD-10-CM | POA: Diagnosis not present

## 2020-10-16 NOTE — Telephone Encounter (Signed)
Tried to reach patient no answer will continue efforts.

## 2020-10-16 NOTE — Telephone Encounter (Signed)
Patient came in stating that she needs to schedule a stress test and echo, but we couldn't find the orders in her chart. CB # [phone]

## 2020-10-17 NOTE — Telephone Encounter (Signed)
Spoke to patient. She was confused. She needed lab work that is long overdue. States she never started torsemide till this week.She will get labs next week. No further questions.

## 2020-10-20 DIAGNOSIS — Z923 Personal history of irradiation: Secondary | ICD-10-CM | POA: Diagnosis not present

## 2020-10-20 DIAGNOSIS — Z48811 Encounter for surgical aftercare following surgery on the nervous system: Secondary | ICD-10-CM | POA: Diagnosis not present

## 2020-10-20 DIAGNOSIS — D320 Benign neoplasm of cerebral meninges: Secondary | ICD-10-CM | POA: Diagnosis not present

## 2020-10-20 DIAGNOSIS — D329 Benign neoplasm of meninges, unspecified: Secondary | ICD-10-CM | POA: Diagnosis not present

## 2020-10-29 DIAGNOSIS — E782 Mixed hyperlipidemia: Secondary | ICD-10-CM | POA: Diagnosis not present

## 2020-10-29 DIAGNOSIS — E1142 Type 2 diabetes mellitus with diabetic polyneuropathy: Secondary | ICD-10-CM | POA: Diagnosis not present

## 2020-10-29 DIAGNOSIS — I2510 Atherosclerotic heart disease of native coronary artery without angina pectoris: Secondary | ICD-10-CM | POA: Diagnosis not present

## 2020-10-29 DIAGNOSIS — Z794 Long term (current) use of insulin: Secondary | ICD-10-CM | POA: Diagnosis not present

## 2020-10-29 DIAGNOSIS — Z01810 Encounter for preprocedural cardiovascular examination: Secondary | ICD-10-CM | POA: Diagnosis not present

## 2020-10-30 LAB — BASIC METABOLIC PANEL
BUN/Creatinine Ratio: 19 (ref 12–28)
BUN: 22 mg/dL (ref 8–27)
CO2: 23 mmol/L (ref 20–29)
Calcium: 9.8 mg/dL (ref 8.7–10.3)
Chloride: 105 mmol/L (ref 96–106)
Creatinine, Ser: 1.16 mg/dL — ABNORMAL HIGH (ref 0.57–1.00)
Glucose: 106 mg/dL — ABNORMAL HIGH (ref 65–99)
Potassium: 3.8 mmol/L (ref 3.5–5.2)
Sodium: 145 mmol/L — ABNORMAL HIGH (ref 134–144)
eGFR: 51 mL/min/{1.73_m2} — ABNORMAL LOW (ref >59)

## 2020-11-28 ENCOUNTER — Other Ambulatory Visit: Payer: Self-pay

## 2020-11-28 DIAGNOSIS — Z9181 History of falling: Secondary | ICD-10-CM | POA: Diagnosis not present

## 2020-11-28 DIAGNOSIS — Z0000 Encounter for general adult medical examination without abnormal findings: Secondary | ICD-10-CM | POA: Diagnosis not present

## 2020-11-28 DIAGNOSIS — E785 Hyperlipidemia, unspecified: Secondary | ICD-10-CM | POA: Diagnosis not present

## 2020-11-28 DIAGNOSIS — Z139 Encounter for screening, unspecified: Secondary | ICD-10-CM | POA: Diagnosis not present

## 2020-12-01 ENCOUNTER — Ambulatory Visit: Payer: Medicare (Managed Care) | Admitting: Cardiology

## 2020-12-01 ENCOUNTER — Telehealth: Payer: Self-pay | Admitting: Cardiology

## 2020-12-01 NOTE — Telephone Encounter (Signed)
Patient called to cancel appt for another day . Patients stated that today appt was supposed to be for a stress test. So she was calling to reschedule that. Explain to the patient that the appt on shows a regular office fix. Please advise.

## 2020-12-02 NOTE — Telephone Encounter (Signed)
Spoke to the patient just now and she let me know that she would not come in until October or November. I scheduled her for October 7th.    Encouraged patient to call back with any questions or concerns.

## 2021-01-14 ENCOUNTER — Emergency Department (HOSPITAL_COMMUNITY): Payer: Medicare Other

## 2021-01-14 ENCOUNTER — Encounter (HOSPITAL_COMMUNITY): Payer: Self-pay

## 2021-01-14 ENCOUNTER — Emergency Department (HOSPITAL_COMMUNITY)
Admission: EM | Admit: 2021-01-14 | Discharge: 2021-01-15 | Disposition: A | Discharge status: Home / Self Care | Payer: Medicare Other | Attending: Emergency Medicine | Admitting: Emergency Medicine

## 2021-01-14 ENCOUNTER — Other Ambulatory Visit: Payer: Self-pay

## 2021-01-14 DIAGNOSIS — I10 Essential (primary) hypertension: Secondary | ICD-10-CM | POA: Diagnosis not present

## 2021-01-14 DIAGNOSIS — R072 Precordial pain: Secondary | ICD-10-CM | POA: Diagnosis not present

## 2021-01-14 DIAGNOSIS — J441 Chronic obstructive pulmonary disease with (acute) exacerbation: Secondary | ICD-10-CM | POA: Diagnosis not present

## 2021-01-14 DIAGNOSIS — Z794 Long term (current) use of insulin: Secondary | ICD-10-CM | POA: Diagnosis not present

## 2021-01-14 DIAGNOSIS — R079 Chest pain, unspecified: Secondary | ICD-10-CM | POA: Diagnosis not present

## 2021-01-14 DIAGNOSIS — E039 Hypothyroidism, unspecified: Secondary | ICD-10-CM | POA: Insufficient documentation

## 2021-01-14 DIAGNOSIS — Z7984 Long term (current) use of oral hypoglycemic drugs: Secondary | ICD-10-CM | POA: Insufficient documentation

## 2021-01-14 DIAGNOSIS — I2510 Atherosclerotic heart disease of native coronary artery without angina pectoris: Secondary | ICD-10-CM | POA: Insufficient documentation

## 2021-01-14 DIAGNOSIS — R0602 Shortness of breath: Secondary | ICD-10-CM | POA: Diagnosis not present

## 2021-01-14 DIAGNOSIS — J45909 Unspecified asthma, uncomplicated: Secondary | ICD-10-CM | POA: Insufficient documentation

## 2021-01-14 DIAGNOSIS — E1142 Type 2 diabetes mellitus with diabetic polyneuropathy: Secondary | ICD-10-CM | POA: Diagnosis not present

## 2021-01-14 DIAGNOSIS — Z79899 Other long term (current) drug therapy: Secondary | ICD-10-CM | POA: Diagnosis not present

## 2021-01-14 DIAGNOSIS — R0789 Other chest pain: Secondary | ICD-10-CM | POA: Diagnosis not present

## 2021-01-14 LAB — CBC WITH DIFFERENTIAL/PLATELET
Abs Immature Granulocytes: 0.05 10*3/uL (ref 0.00–0.07)
Basophils Absolute: 0.1 10*3/uL (ref 0.0–0.1)
Basophils Relative: 1 %
Eosinophils Absolute: 0.2 10*3/uL (ref 0.0–0.5)
Eosinophils Relative: 3 %
HCT: 37.5 % (ref 36.0–46.0)
Hemoglobin: 12.4 g/dL (ref 12.0–15.0)
Immature Granulocytes: 1 %
Lymphocytes Relative: 30 %
Lymphs Abs: 1.9 10*3/uL (ref 0.7–4.0)
MCH: 31.3 pg (ref 26.0–34.0)
MCHC: 33.1 g/dL (ref 30.0–36.0)
MCV: 94.7 fL (ref 80.0–100.0)
Monocytes Absolute: 0.8 10*3/uL (ref 0.1–1.0)
Monocytes Relative: 12 %
Neutro Abs: 3.4 10*3/uL (ref 1.7–7.7)
Neutrophils Relative %: 53 %
Platelets: 245 10*3/uL (ref 150–400)
RBC: 3.96 MIL/uL (ref 3.87–5.11)
RDW: 14.5 % (ref 11.5–15.5)
WBC: 6.3 10*3/uL (ref 4.0–10.5)
nRBC: 0 % (ref 0.0–0.2)

## 2021-01-14 LAB — COMPREHENSIVE METABOLIC PANEL
ALT: 15 U/L (ref 0–44)
AST: 20 U/L (ref 15–41)
Albumin: 3.9 g/dL (ref 3.5–5.0)
Alkaline Phosphatase: 54 U/L (ref 38–126)
Anion gap: 11 (ref 5–15)
BUN: 21 mg/dL (ref 8–23)
CO2: 29 mmol/L (ref 22–32)
Calcium: 9.4 mg/dL (ref 8.9–10.3)
Chloride: 104 mmol/L (ref 98–111)
Creatinine, Ser: 1 mg/dL (ref 0.44–1.00)
GFR, Estimated: >60 mL/min (ref >60)
Glucose, Bld: 189 mg/dL — ABNORMAL HIGH (ref 70–99)
Potassium: 3.8 mmol/L (ref 3.5–5.1)
Sodium: 144 mmol/L (ref 135–145)
Total Bilirubin: 0.9 mg/dL (ref 0.3–1.2)
Total Protein: 7.3 g/dL (ref 6.5–8.1)

## 2021-01-14 LAB — TROPONIN I (HIGH SENSITIVITY)
Troponin I (High Sensitivity): 9 ng/L (ref <18)
Troponin I (High Sensitivity): 9 ng/L (ref <18)

## 2021-01-14 NOTE — ED Provider Notes (Signed)
[facility] DEPT Provider Note   CSN: [id number] Arrival date & time: 01/14/21  2016     History Chief Complaint  Patient presents with   Chest Pain    Michelle Harding is a 70 y.o. female.  The history is provided by the patient.  Chest Pain She has history of hypertension, diabetes, hyperlipidemia and comes in with chest pain which started yesterday.  Pain is mid and lower sternal with radiation to the left arm.  She describes it as sharp and as severe as 8/10.  Pain comes on intermittently without any pattern and will last for 3-4 minutes before resolving.  When present, nothing makes it better and nothing makes it worse.  There is some associated dyspnea, nausea, diaphoresis.  She has noticed a slight decrease in her exercise tolerance recently.  She denies fever or chills.  She did have surgery for rotator cuff repair on her left shoulder several months ago.  She is a non-smoker.   Past Medical History:  Diagnosis Date   Aortic regurgitation 08/06/2015   Asthma    Bradycardia by electrocardiogram 02/10/2012   Chronic constipation    Community acquired pneumonia 02/07/2012   COPD (chronic obstructive pulmonary disease) (HCC)    Coronary artery disease involving native coronary artery of native heart without angina pectoris 04/24/2015   Luminal disease by cardiac catheterization from 2013   Diabetes mellitus ([place]) 02/07/2012   Diabetes, polyneuropathy ([place]) 08/06/2015   Dyslipidemia 04/24/2015   Gout attack 02/07/2012   Heart disease    Hemorrhoids    Herniated nucleus pulposus, L5-S1, right 08/06/2015   Formatting of this note might be different from the original. Right L5 and S1 roots.  L4-5, possible left L4 root   Hyperlipidemia 08/06/2015   Hypertension    Hypothyroidism 08/06/2015   IBS (irritable bowel syndrome)    Lumbar radiculopathy 08/06/2015   Formatting of this note might be different from the original. Right   Meningioma ([place])    Morbid  obesity ([place]) 03/31/2020   Osteoarthritis    Osteoarthritis    Ovarian cyst    Type 2 diabetes mellitus with diabetic polyneuropathy, with long-term current use of insulin ([place]) 08/06/2015    Patient Active Problem List   Diagnosis Date Noted   Preop cardiovascular exam 07/01/2020   Ovarian cyst    Osteoarthritis    Meningioma (HCC)    IBS (irritable bowel syndrome)    Hemorrhoids    Heart disease    COPD (chronic obstructive pulmonary disease) (HCC)    Chronic constipation    Morbid obesity ([place]) 03/31/2020   Asthma 08/06/2015   Aortic regurgitation 08/06/2015   Diabetes, polyneuropathy ([place]) 08/06/2015   Hyperlipidemia 08/06/2015   Type 2 diabetes mellitus with diabetic polyneuropathy, with long-term current use of insulin ([place]) 08/06/2015   Herniated nucleus pulposus, L5-S1, right 08/06/2015   Lumbar radiculopathy 08/06/2015   Coronary artery disease involving native coronary artery of native heart without angina pectoris 04/24/2015   Dyslipidemia 04/24/2015   Bradycardia by electrocardiogram 02/10/2012   Hypertension 02/07/2012   Diabetes mellitus ([place]) 02/07/2012   Gout attack 02/07/2012    Past Surgical History:  Procedure Laterality Date   CATARACT EXTRACTION Left 03/2013   CATARACT EXTRACTION Right 05/2013   CHOLECYSTECTOMY  1983   COLONOSCOPY  04/27/2012   Colonic polyp status post polypectomy. Minimal sigmoid diverticulosis. Small internal hemorrhoids.    ESOPHAGOGASTRODUODENOSCOPY  06/24/2010   Small hiatal hernia   HAND SURGERY  HERNIA REPAIR     With mesh   KNEE SURGERY  2012   OOPHORECTOMY Left 07/10/2015   OOPHORECTOMY Right 1989   ROTATOR CUFF REPAIR     TONSILLECTOMY     TONSILLECTOMY AND ADENOIDECTOMY       OB History   No obstetric history on file.     Family History  Problem Relation Age of Onset   Diabetes type II Father    Diabetes type II Son    Diabetes Paternal Grandmother    Hypertension Paternal Grandmother    Hypertension  Daughter    Colon cancer Neg Hx    Esophageal cancer Neg Hx    Breast cancer Neg Hx     Social History   Tobacco Use   Smoking status: Never   Smokeless tobacco: Never   Tobacco comments:    did smoke from time to time but stopped 2018  Vaping Use   Vaping Use: Never used  Substance Use Topics   Alcohol use: Yes    Comment: ocassionally   Drug use: Not Currently    Types: Marijuana    Comment: use was in the past     Home Medications Prior to Admission medications   Medication Sig Start Date End Date Taking? Authorizing Provider  albuterol (PROVENTIL HFA;VENTOLIN HFA) 108 (90 BASE) MCG/ACT inhaler Inhale 2 puffs into the lungs every 6 (six) hours as needed. For shortness of breath    [provider]  allopurinol (ZYLOPRIM) 300 MG tablet Take 300 mg by mouth as needed (arthritis). 08/25/19   [provider]  Cholecalciferol (VITAMIN D3) 1.25 MG (50000 UT) CAPS Take 1 capsule by mouth once a week. 06/09/20   [provider]  colchicine 0.6 MG tablet Take 0.6 mg by mouth as needed (Gout). 04/12/19   [provider]  dapagliflozin propanediol (FARXIGA) 10 MG TABS tablet Take 10 mg by mouth daily.     [provider]  Flaxseed, Linseed, (GNP FLAX SEED OIL) 1000 MG CAPS Take 1,000 mg by mouth daily.    [provider]  furosemide (LASIX) 20 MG tablet Take 20 mg by mouth daily. 09/30/20   [provider]  insulin degludec (TRESIBA) 200 UNIT/ML FlexTouch Pen Inject 45 Units into the skin daily. 09/29/20   [provider]  levothyroxine (SYNTHROID, LEVOTHROID) 25 MCG tablet Take 50 mcg by mouth every other day. 05/21/14   [provider]  nitroGLYCERIN (NITROSTAT) 0.4 MG SL tablet Place 1 tablet under the tongue as needed for chest pain.    [provider]  nystatin cream (MYCOSTATIN) Apply 1 application topically as needed. 08/28/19   [provider]  omeprazole (PRILOSEC) 20 MG capsule Take 20 mg  by mouth daily. 07/21/19   [provider]  permethrin (ELIMITE) 5 % cream Apply 1 application topically once a week. 04/15/19   [provider]  predniSONE (DELTASONE) 5 MG tablet Take 5 mg by mouth as needed (Arthritis). 03/30/19   [provider]  REPATHA SURECLICK [id number] MG/ML SOAJ Inject as directed every 14 (fourteen) days. 08/31/19   [provider]  sertraline (ZOLOFT) 100 MG tablet Take 100 mg by mouth daily. 04/10/20   [provider]  topiramate (TOPAMAX) 25 MG tablet Take 25 mg by mouth 2 (two) times daily. 10/11/18   [provider]  torsemide (DEMADEX) 5 MG tablet Take 1 tablet (5 mg total) by mouth daily. 07/01/20 09/29/20  Park Liter, MD  TOUJEO SOLOSTAR 300 UNIT/ML  Solostar Pen daily. 08/31/19   [provider]  triamcinolone (KENALOG) 0.025 % cream Apply 1 application topically 2 (two) times daily. 05/13/20   [provider]  TRULICITY 1.5 0000000 SOPN Inject into the skin once a week. 08/30/19   [provider]  valsartan-hydrochlorothiazide (DIOVAN-HCT) 320-25 MG tablet Take 1 tablet by mouth daily.    [provider]  Vitamin D, Ergocalciferol, (DRISDOL) 1.25 MG (50000 UNIT) CAPS capsule Take 50,000 Units by mouth once a week. 08/27/19   [provider]  vitamin E 1000 UNIT capsule Take 1,000 Units by mouth daily.    [provider]    Allergies    Statins, Furosemide, Rosuvastatin, Aspirin, and Vicodin [hydrocodone-acetaminophen]  Review of Systems   Review of Systems  Cardiovascular:  Positive for chest pain.  All other systems reviewed and are negative.  Physical Exam Updated Vital Signs BP (!) 232/113   Pulse 72   Temp 99.2 F (37.3 C) (Oral)   Resp 18   Ht 5' 6.5" (1.689 m)   Wt 111.1 kg   SpO2 100%   BMI 38.95 kg/m   Physical Exam Vitals and nursing note reviewed.  70 year old female, resting comfortably and in no acute distress. Vital signs are  significant for elevated blood pressure. Oxygen saturation is 100%, which is normal. Head is normocephalic and atraumatic. PERRLA, EOMI. Oropharynx is clear. Neck is nontender and supple without adenopathy or JVD. Back is nontender and there is no CVA tenderness. Lungs are clear without rales, wheezes, or rhonchi. Chest is mildly tender in the midsternal area.  There is no crepitus. Heart has regular rate and rhythm without murmur. Abdomen is soft, flat, nontender without masses or hepatosplenomegaly and peristalsis is normoactive. Extremities have trace edema, full range of motion is present. Skin is warm and dry without rash. Neurologic: Mental status is normal, cranial nerves are intact, there are no motor or sensory deficits.  ED Results / Procedures / Treatments   Labs (all labs ordered are listed, but only abnormal results are displayed) Labs Reviewed  COMPREHENSIVE METABOLIC PANEL - Abnormal; Notable for the following components:      Result Value   Glucose, Bld 189 (*)    All other components within normal limits  CBC WITH DIFFERENTIAL/PLATELET  TROPONIN I (HIGH SENSITIVITY)  TROPONIN I (HIGH SENSITIVITY)    EKG EKG Interpretation  Date/Time:  Wednesday January 14 2021 20:29:15 EDT Ventricular Rate:  81 PR Interval:  172 QRS Duration: 99 QT Interval:  527 QTC Calculation: 612 R Axis:   16 Text Interpretation: Sinus rhythm Borderline T wave abnormalities Prolonged QT interval Baseline wander in lead(s) II III aVF V1 When compared with ECG of 02/10/2012, T wave abnormality, consider anterior ischemia Nonspecific T wave abnormality is now present Reconfirmed by Delora Fuel ([id number]) on 01/14/2021 11:48:06 PM  Radiology DG Chest 2 View  Result Date: 01/14/2021 CLINICAL DATA:  70 year old female with history of shortness of breath and chest pain radiating into the right arm for 1 week. EXAM: CHEST - 2 VIEW COMPARISON:  Chest x-ray 08/21/2017. FINDINGS: Lung volumes are normal.  No consolidative airspace disease. No pleural effusions. No pneumothorax. No pulmonary nodule or mass noted. Pulmonary vasculature and the cardiomediastinal silhouette are within normal limits. IMPRESSION: No radiographic evidence of acute cardiopulmonary disease. Electronically Signed   By: Vinnie Langton M.D.   On: 01/14/2021 21:06    Procedures Procedures   Medications Ordered in ED Medications  hydrALAZINE (APRESOLINE) tablet 25 mg (  25 mg Oral Given 01/15/21 0227)    ED Course  I have reviewed the triage vital signs and the nursing notes.  Pertinent labs & imaging results that were available during my care of the patient were reviewed by me and considered in my medical decision making (see chart for details).   MDM Rules/Calculators/A&P                         Chest pain of uncertain cause.  Pattern is not typical of ACS or pulmonary embolism or pneumonia.  There is some chest wall tenderness.  ECG shows no acute changes but prolonged QT interval is noted and will need to check magnesium level.  Chest x-ray is normal.  Troponin is normal x2.  With recent surgery, she would be at risk for pulmonary embolism, will screen with D-dimer.  Elevated blood pressure is a concern and it has been consistently elevated while in the ED. patient says that she does check her blood pressure at home, but has not done so for several weeks.  Last several outpatient visits have had significant blood pressure elevation, I suspect she will need additional antihypertensive medication added to her regimen.  On review of old records, cardiac catheterization in 2013 showed only mild luminal disease.  D-dimer is normal, when adjusted for age.  Magnesium is normal.  She continues pain-free in the ED.  Blood pressure continues to stay very elevated.  She is given a dose of hydralazine which has reduced her blood pressure to a reasonable level.  She likely will need to be on additional antihypertensive medications.  She is  discharged with prescription for hydralazine and advised to follow-up with her cardiologist or primary care provider in the next 2 weeks.  Return precautions discussed.  Final Clinical Impression(s) / ED Diagnoses Final diagnoses:  Nonspecific chest pain  Elevated blood pressure reading with diagnosis of hypertension    Rx / DC Orders ED Discharge Orders          Ordered    hydrALAZINE (APRESOLINE) 25 MG tablet  3 times daily        01/15/21 [id number]             Delora Fuel, MD [id number]

## 2021-01-14 NOTE — ED Triage Notes (Signed)
Pt reports intermittent sternal cp radiating to right arm for around 1 week. When pain is present making it difficult to breath.

## 2021-01-14 NOTE — ED Provider Notes (Signed)
Emergency Medicine Provider Triage Evaluation Note  Michelle Harding , a 70 y.o. female  was evaluated in triage.  Pt complains of intermittent chest pain and occipital head pain.  Pain has been intermittent x1 week.  Chest pain is midsternal described as sharp and radiates to left arm.  No aggravating factors.    Review of Systems  Positive: Chest pain, head pain, blurry vision Negative: Back pain, syncope, numbness, weakness, facial asymmetry, aphasia, dysphagia  Physical Exam  BP (!) 242/128 (BP Location: Right Arm) Comment: Triage RN notified  Pulse 83   Temp 99.2 F (37.3 C) (Oral)   Resp 18   Ht 5' 6.5" (1.689 m)   Wt 111.1 kg   SpO2 98%   BMI 38.95 kg/m  Gen:   Awake, no distress   Resp:  Normal effort, lungs clear to auscultation bilaterally MSK:   Moves extremities without difficulty  Other:  +2 radial pulse bilaterally.  No facial asymmetry, aphasia or dysphagia.  Medical Decision Making  Medically screening exam initiated at 8:41 PM.  Appropriate orders placed.  Michelle Harding was informed that the remainder of the evaluation will be completed by another provider, this initial triage assessment does not replace that evaluation, and the importance of remaining in the ED until their evaluation is complete.  Patient will be made a level 2 and move back to next available room.   Michelle Harding 01/14/21 2046    Michelle Lemming, MD 01/14/21 2144

## 2021-01-15 LAB — D-DIMER, QUANTITATIVE: D-Dimer, Quant: 0.53 ug/mL-FEU — ABNORMAL HIGH (ref 0.00–0.50)

## 2021-01-15 LAB — MAGNESIUM: Magnesium: 2 mg/dL (ref 1.7–2.4)

## 2021-01-15 MED ORDER — HYDRALAZINE HCL 25 MG PO TABS
25.0000 mg | ORAL_TABLET | Freq: Once | ORAL | Status: AC
  Administered 2021-01-15: 25 mg via ORAL
  Filled 2021-01-15: qty 1

## 2021-01-15 MED ORDER — HYDRALAZINE HCL 25 MG PO TABS: 25.0000 mg | ORAL_TABLET | Freq: Three times a day (TID) | ORAL | 0 refills | Status: DC

## 2021-01-15 NOTE — Discharge Instructions (Addendum)
Please check your blood pressure once a day and keep a record of it.  Take that record with you when you see either your cardiologist or your primary care provider.  You have been given a prescription for hydralazine.  This is to try to keep your blood pressure at a reasonable level until you can see your primary care provider or cardiologist.  They will likely change you to a medication that is more convenient to take.  Please return if you are having any worsening symptoms.

## 2021-01-15 NOTE — ED Notes (Signed)
ED Provider at bedside. 

## 2021-02-11 DIAGNOSIS — L989 Disorder of the skin and subcutaneous tissue, unspecified: Secondary | ICD-10-CM | POA: Diagnosis not present

## 2021-02-11 DIAGNOSIS — E1140 Type 2 diabetes mellitus with diabetic neuropathy, unspecified: Secondary | ICD-10-CM | POA: Diagnosis not present

## 2021-02-11 DIAGNOSIS — I10 Essential (primary) hypertension: Secondary | ICD-10-CM | POA: Diagnosis not present

## 2021-02-11 DIAGNOSIS — R600 Localized edema: Secondary | ICD-10-CM | POA: Diagnosis not present

## 2021-02-11 DIAGNOSIS — J309 Allergic rhinitis, unspecified: Secondary | ICD-10-CM | POA: Diagnosis not present

## 2021-02-11 DIAGNOSIS — Z8739 Personal history of other diseases of the musculoskeletal system and connective tissue: Secondary | ICD-10-CM | POA: Diagnosis not present

## 2021-02-13 ENCOUNTER — Ambulatory Visit: Payer: Medicare Other | Admitting: Cardiology

## 2021-02-18 DIAGNOSIS — R600 Localized edema: Secondary | ICD-10-CM | POA: Diagnosis not present

## 2021-02-18 DIAGNOSIS — I10 Essential (primary) hypertension: Secondary | ICD-10-CM | POA: Diagnosis not present

## 2021-02-18 DIAGNOSIS — E785 Hyperlipidemia, unspecified: Secondary | ICD-10-CM | POA: Diagnosis not present

## 2021-02-18 DIAGNOSIS — L989 Disorder of the skin and subcutaneous tissue, unspecified: Secondary | ICD-10-CM | POA: Diagnosis not present

## 2021-02-18 DIAGNOSIS — E039 Hypothyroidism, unspecified: Secondary | ICD-10-CM | POA: Diagnosis not present

## 2021-03-17 DIAGNOSIS — I10 Essential (primary) hypertension: Secondary | ICD-10-CM | POA: Diagnosis not present

## 2021-03-17 DIAGNOSIS — M109 Gout, unspecified: Secondary | ICD-10-CM | POA: Diagnosis not present

## 2021-04-23 ENCOUNTER — Ambulatory Visit: Payer: Medicare Other | Admitting: Cardiology

## 2021-05-23 DIAGNOSIS — S93401A Sprain of unspecified ligament of right ankle, initial encounter: Secondary | ICD-10-CM | POA: Diagnosis not present

## 2021-05-23 DIAGNOSIS — W19XXXA Unspecified fall, initial encounter: Secondary | ICD-10-CM | POA: Diagnosis not present

## 2021-06-09 DIAGNOSIS — J449 Chronic obstructive pulmonary disease, unspecified: Secondary | ICD-10-CM | POA: Diagnosis not present

## 2021-06-09 DIAGNOSIS — I10 Essential (primary) hypertension: Secondary | ICD-10-CM | POA: Diagnosis not present

## 2021-06-11 DIAGNOSIS — S335XXA Sprain of ligaments of lumbar spine, initial encounter: Secondary | ICD-10-CM | POA: Diagnosis not present

## 2021-06-11 DIAGNOSIS — S8391XA Sprain of unspecified site of right knee, initial encounter: Secondary | ICD-10-CM | POA: Diagnosis not present

## 2021-06-11 DIAGNOSIS — M545 Low back pain, unspecified: Secondary | ICD-10-CM | POA: Diagnosis not present

## 2021-06-11 DIAGNOSIS — M25551 Pain in right hip: Secondary | ICD-10-CM | POA: Diagnosis not present

## 2021-06-11 DIAGNOSIS — Z043 Encounter for examination and observation following other accident: Secondary | ICD-10-CM | POA: Diagnosis not present

## 2021-07-03 DIAGNOSIS — M1711 Unilateral primary osteoarthritis, right knee: Secondary | ICD-10-CM | POA: Diagnosis not present

## 2021-07-07 DIAGNOSIS — J449 Chronic obstructive pulmonary disease, unspecified: Secondary | ICD-10-CM | POA: Diagnosis not present

## 2021-07-07 DIAGNOSIS — I10 Essential (primary) hypertension: Secondary | ICD-10-CM | POA: Diagnosis not present

## 2021-07-17 DIAGNOSIS — M48061 Spinal stenosis, lumbar region without neurogenic claudication: Secondary | ICD-10-CM | POA: Diagnosis not present

## 2021-08-07 DIAGNOSIS — J449 Chronic obstructive pulmonary disease, unspecified: Secondary | ICD-10-CM | POA: Diagnosis not present

## 2021-08-07 DIAGNOSIS — E785 Hyperlipidemia, unspecified: Secondary | ICD-10-CM | POA: Diagnosis not present

## 2021-08-07 DIAGNOSIS — I10 Essential (primary) hypertension: Secondary | ICD-10-CM | POA: Diagnosis not present

## 2021-08-07 DIAGNOSIS — R6889 Other general symptoms and signs: Secondary | ICD-10-CM | POA: Diagnosis not present

## 2021-08-07 DIAGNOSIS — M48062 Spinal stenosis, lumbar region with neurogenic claudication: Secondary | ICD-10-CM | POA: Diagnosis not present

## 2021-08-07 DIAGNOSIS — M545 Low back pain, unspecified: Secondary | ICD-10-CM | POA: Diagnosis not present

## 2021-08-13 DIAGNOSIS — M1712 Unilateral primary osteoarthritis, left knee: Secondary | ICD-10-CM | POA: Diagnosis not present

## 2021-08-13 DIAGNOSIS — R6889 Other general symptoms and signs: Secondary | ICD-10-CM | POA: Diagnosis not present

## 2021-08-27 DIAGNOSIS — E039 Hypothyroidism, unspecified: Secondary | ICD-10-CM | POA: Diagnosis not present

## 2021-08-27 DIAGNOSIS — Z7984 Long term (current) use of oral hypoglycemic drugs: Secondary | ICD-10-CM | POA: Diagnosis not present

## 2021-08-27 DIAGNOSIS — R6889 Other general symptoms and signs: Secondary | ICD-10-CM | POA: Diagnosis not present

## 2021-08-27 DIAGNOSIS — Z7985 Long-term (current) use of injectable non-insulin antidiabetic drugs: Secondary | ICD-10-CM | POA: Diagnosis not present

## 2021-08-27 DIAGNOSIS — E1142 Type 2 diabetes mellitus with diabetic polyneuropathy: Secondary | ICD-10-CM | POA: Diagnosis not present

## 2021-08-27 DIAGNOSIS — E782 Mixed hyperlipidemia: Secondary | ICD-10-CM | POA: Diagnosis not present

## 2021-08-27 DIAGNOSIS — Z794 Long term (current) use of insulin: Secondary | ICD-10-CM | POA: Diagnosis not present

## 2021-08-27 DIAGNOSIS — I10 Essential (primary) hypertension: Secondary | ICD-10-CM | POA: Diagnosis not present

## 2021-08-27 DIAGNOSIS — Z6841 Body Mass Index (BMI) 40.0 and over, adult: Secondary | ICD-10-CM | POA: Diagnosis not present

## 2021-08-28 DIAGNOSIS — R6889 Other general symptoms and signs: Secondary | ICD-10-CM | POA: Diagnosis not present

## 2021-08-28 DIAGNOSIS — M47816 Spondylosis without myelopathy or radiculopathy, lumbar region: Secondary | ICD-10-CM | POA: Diagnosis not present

## 2021-09-01 DIAGNOSIS — R6889 Other general symptoms and signs: Secondary | ICD-10-CM | POA: Diagnosis not present

## 2021-09-01 DIAGNOSIS — M1711 Unilateral primary osteoarthritis, right knee: Secondary | ICD-10-CM | POA: Diagnosis not present

## 2021-09-06 DIAGNOSIS — E1169 Type 2 diabetes mellitus with other specified complication: Secondary | ICD-10-CM | POA: Diagnosis not present

## 2021-09-06 DIAGNOSIS — J449 Chronic obstructive pulmonary disease, unspecified: Secondary | ICD-10-CM | POA: Diagnosis not present

## 2021-09-06 DIAGNOSIS — I10 Essential (primary) hypertension: Secondary | ICD-10-CM | POA: Diagnosis not present

## 2021-09-15 DIAGNOSIS — E039 Hypothyroidism, unspecified: Secondary | ICD-10-CM | POA: Diagnosis not present

## 2021-09-15 DIAGNOSIS — E1169 Type 2 diabetes mellitus with other specified complication: Secondary | ICD-10-CM | POA: Diagnosis not present

## 2021-09-15 DIAGNOSIS — I10 Essential (primary) hypertension: Secondary | ICD-10-CM | POA: Diagnosis not present

## 2021-09-15 DIAGNOSIS — E118 Type 2 diabetes mellitus with unspecified complications: Secondary | ICD-10-CM | POA: Diagnosis not present

## 2021-09-15 DIAGNOSIS — R600 Localized edema: Secondary | ICD-10-CM | POA: Diagnosis not present

## 2021-09-15 DIAGNOSIS — R6889 Other general symptoms and signs: Secondary | ICD-10-CM | POA: Diagnosis not present

## 2021-09-15 DIAGNOSIS — Z79899 Other long term (current) drug therapy: Secondary | ICD-10-CM | POA: Diagnosis not present

## 2021-09-15 DIAGNOSIS — E785 Hyperlipidemia, unspecified: Secondary | ICD-10-CM | POA: Diagnosis not present

## 2021-09-15 DIAGNOSIS — F439 Reaction to severe stress, unspecified: Secondary | ICD-10-CM | POA: Diagnosis not present

## 2021-09-15 DIAGNOSIS — J309 Allergic rhinitis, unspecified: Secondary | ICD-10-CM | POA: Diagnosis not present

## 2021-09-16 DIAGNOSIS — M47816 Spondylosis without myelopathy or radiculopathy, lumbar region: Secondary | ICD-10-CM | POA: Diagnosis not present

## 2021-09-16 DIAGNOSIS — R6889 Other general symptoms and signs: Secondary | ICD-10-CM | POA: Diagnosis not present

## 2021-09-30 DIAGNOSIS — M545 Low back pain, unspecified: Secondary | ICD-10-CM | POA: Diagnosis not present

## 2021-09-30 DIAGNOSIS — R6889 Other general symptoms and signs: Secondary | ICD-10-CM | POA: Diagnosis not present

## 2021-09-30 DIAGNOSIS — M47817 Spondylosis without myelopathy or radiculopathy, lumbosacral region: Secondary | ICD-10-CM | POA: Diagnosis not present

## 2021-10-07 DIAGNOSIS — F339 Major depressive disorder, recurrent, unspecified: Secondary | ICD-10-CM | POA: Diagnosis not present

## 2021-10-07 DIAGNOSIS — Z6841 Body Mass Index (BMI) 40.0 and over, adult: Secondary | ICD-10-CM | POA: Diagnosis not present

## 2021-10-07 DIAGNOSIS — F439 Reaction to severe stress, unspecified: Secondary | ICD-10-CM | POA: Diagnosis not present

## 2021-10-07 DIAGNOSIS — I10 Essential (primary) hypertension: Secondary | ICD-10-CM | POA: Diagnosis not present

## 2021-10-26 DIAGNOSIS — R6889 Other general symptoms and signs: Secondary | ICD-10-CM | POA: Diagnosis not present

## 2021-12-15 ENCOUNTER — Telehealth: Payer: Self-pay | Admitting: Cardiology

## 2021-12-15 NOTE — Telephone Encounter (Signed)
Pt c/o BP issue: STAT if pt c/o blurred vision, one-sided weakness or slurred speech  1. What are your last 5 BP readings? 2 something/104; 187/87  2. Are you having any other symptoms (ex. Dizziness, headache, blurred vision, passed out)? Headache , slight pain in chest (but it went away)  3. What is your BP issue? High    Made patient appt on 8/10 for [facility]

## 2021-12-17 ENCOUNTER — Ambulatory Visit: Payer: Medicare Other | Admitting: Nurse Practitioner

## 2021-12-17 NOTE — Progress Notes (Deleted)
Office Visit    Patient Name: Michelle Harding Date of Encounter: 12/17/2021  Primary Care Provider:  Nicholos Johns, MD Primary Cardiologist:  Jenne Campus, MD  Chief Complaint    71 year old female with a history of CAD (cardiac catheterization in 2013 showed luminal disease only), bradycardia, hypertension, hyperlipidemia, type 2 diabetes, COPD, hypothyroidism, IBS, osteoarthritis, and gout who presents for follow-up related to CAD and hypertension.  Past Medical History    Past Medical History:  Diagnosis Date   Aortic regurgitation 08/06/2015   Asthma    Bradycardia by electrocardiogram 02/10/2012   Chronic constipation    Community acquired pneumonia 02/07/2012   COPD (chronic obstructive pulmonary disease) (HCC)    Coronary artery disease involving native coronary artery of native heart without angina pectoris 04/24/2015   Luminal disease by cardiac catheterization from 2013   Diabetes mellitus ([place]) 02/07/2012   Diabetes, polyneuropathy ([place]) 08/06/2015   Dyslipidemia 04/24/2015   Gout attack 02/07/2012   Heart disease    Hemorrhoids    Herniated nucleus pulposus, L5-S1, right 08/06/2015   Formatting of this note might be different from the original. Right L5 and S1 roots.  L4-5, possible left L4 root   Hyperlipidemia 08/06/2015   Hypertension    Hypothyroidism 08/06/2015   IBS (irritable bowel syndrome)    Lumbar radiculopathy 08/06/2015   Formatting of this note might be different from the original. Right   Meningioma ([place])    Morbid obesity ([place]) 03/31/2020   Osteoarthritis    Osteoarthritis    Ovarian cyst    Type 2 diabetes mellitus with diabetic polyneuropathy, with long-term current use of insulin ([place]) 08/06/2015   Past Surgical History:  Procedure Laterality Date   CATARACT EXTRACTION Left 03/2013   CATARACT EXTRACTION Right 05/2013   CHOLECYSTECTOMY  1983   COLONOSCOPY  04/27/2012   Colonic polyp status post polypectomy. Minimal sigmoid diverticulosis.  Small internal hemorrhoids.    ESOPHAGOGASTRODUODENOSCOPY  06/24/2010   Small hiatal hernia   HAND SURGERY     HERNIA REPAIR     With mesh   KNEE SURGERY  2012   OOPHORECTOMY Left 07/10/2015   OOPHORECTOMY Right 1989   ROTATOR CUFF REPAIR     TONSILLECTOMY     TONSILLECTOMY AND ADENOIDECTOMY      Allergies  Allergies  Allergen Reactions   Statins Swelling   Furosemide Swelling   Rosuvastatin Swelling   Aspirin     Patient says it hurts her stomach, has to be coaed   Vicodin [Hydrocodone-Acetaminophen]     Patient says Vicodin makes her itch    History of Present Illness    71 year old female with thea above past medical history including CAD (cardiac catheterization in 2013 showed luminal disease only), bradycardia, hypertension, hyperlipidemia, type 2 diabetes, COPD, hypothyroidism, IBS, osteoarthritis, and gout.  Catheterization in 2013 showed luminal disease.  Stress test in 2012 showed no evidence of ischemia.  Cardiogram in 12/2018 showed EF 60 to 65%, severe concentric LVH, no RWMA, no significant valvular abnormalities.  Stress test in October 2020 was normal.  He was last seen in the office on 07/01/2020 and was stable from a cardiac standpoint.  She denied symptoms concerning for angina.  She was cleared for shoulder surgery.  She was noted to have elevated BP.,  She complained of bilateral lower extremity edema.  She was started on Lasix 20 mg daily.-Our office on 12/15/2021 with concerns for elevated BP, chest discomfort, and headache.  She was advised to follow-up in  clinic.    She presents today for follow-up.  Since her last visit and since she contacted our office  CAD/chest pain: Hypertension: Hyperlipidemia: Type 2 diabetes: Hypothyroidism: Disposition:  Home Medications    Current Outpatient Medications  Medication Sig Dispense Refill   albuterol (PROVENTIL HFA;VENTOLIN HFA) 108 (90 BASE) MCG/ACT inhaler Inhale 2 puffs into the lungs every 6 (six) hours as  needed. For shortness of breath     allopurinol (ZYLOPRIM) 300 MG tablet Take 300 mg by mouth as needed (arthritis).     Cholecalciferol (VITAMIN D3) 1.25 MG (50000 UT) CAPS Take 1 capsule by mouth once a week.     colchicine 0.6 MG tablet Take 0.6 mg by mouth as needed (Gout).     dapagliflozin propanediol (FARXIGA) 10 MG TABS tablet Take 10 mg by mouth daily.      Flaxseed, Linseed, (GNP FLAX SEED OIL) 1000 MG CAPS Take 1,000 mg by mouth daily.     furosemide (LASIX) 20 MG tablet Take 20 mg by mouth daily.     hydrALAZINE (APRESOLINE) 25 MG tablet Take 1 tablet (25 mg total) by mouth 3 (three) times daily. 90 tablet 0   insulin degludec (TRESIBA) 200 UNIT/ML FlexTouch Pen Inject 45 Units into the skin daily.     levothyroxine (SYNTHROID, LEVOTHROID) 25 MCG tablet Take 50 mcg by mouth every other day.     nitroGLYCERIN (NITROSTAT) 0.4 MG SL tablet Place 1 tablet under the tongue as needed for chest pain.     nystatin cream (MYCOSTATIN) Apply 1 application topically as needed.     omeprazole (PRILOSEC) 20 MG capsule Take 20 mg by mouth daily.     permethrin (ELIMITE) 5 % cream Apply 1 application topically once a week.     predniSONE (DELTASONE) 5 MG tablet Take 5 mg by mouth as needed (Arthritis).     REPATHA SURECLICK 998 MG/ML SOAJ Inject as directed every 14 (fourteen) days.     sertraline (ZOLOFT) 100 MG tablet Take 100 mg by mouth daily.     topiramate (TOPAMAX) 25 MG tablet Take 25 mg by mouth 2 (two) times daily.     torsemide (DEMADEX) 5 MG tablet Take 1 tablet (5 mg total) by mouth daily. 90 tablet 1   TOUJEO SOLOSTAR 300 UNIT/ML Solostar Pen daily.     triamcinolone (KENALOG) 0.025 % cream Apply 1 application topically 2 (two) times daily.     TRULICITY 1.5 PJ/8.2NK SOPN Inject into the skin once a week.     valsartan-hydrochlorothiazide (DIOVAN-HCT) 320-25 MG tablet Take 1 tablet by mouth daily.     Vitamin D, Ergocalciferol, (DRISDOL) 1.25 MG (50000 UNIT) CAPS capsule Take 50,000  Units by mouth once a week.     vitamin E 1000 UNIT capsule Take 1,000 Units by mouth daily.     No current facility-administered medications for this visit.     Review of Systems    ***.  All other systems reviewed and are otherwise negative except as noted above.    Physical Exam    VS:  There were no vitals taken for this visit. , BMI There is no height or weight on file to calculate BMI.     GEN: Well nourished, well developed, in no acute distress. HEENT: normal. Neck: Supple, no JVD, carotid bruits, or masses. Cardiac: RRR, no murmurs, rubs, or gallops. No clubbing, cyanosis, edema.  Radials/DP/PT 2+ and equal bilaterally.  Respiratory:  Respirations regular and unlabored, clear to auscultation bilaterally. GI: Soft,  nontender, nondistended, BS + x 4. MS: no deformity or atrophy. Skin: warm and dry, no rash. Neuro:  Strength and sensation are intact. Psych: Normal affect.  Accessory Clinical Findings    ECG personally reviewed by me today - *** - no acute changes.  Lab Results  Component Value Date   WBC 6.3 01/14/2021   HGB 12.4 01/14/2021   HCT 37.5 01/14/2021   MCV 94.7 01/14/2021   PLT 245 01/14/2021   Lab Results  Component Value Date   CREATININE 1.00 01/14/2021   BUN 21 01/14/2021   NA 144 01/14/2021   K 3.8 01/14/2021   CL 104 01/14/2021   CO2 29 01/14/2021   Lab Results  Component Value Date   ALT 15 01/14/2021   AST 20 01/14/2021   ALKPHOS 54 01/14/2021   BILITOT 0.9 01/14/2021   No results found for: "CHOL", "HDL", "LDLCALC", "LDLDIRECT", "TRIG", "CHOLHDL"  Lab Results  Component Value Date   HGBA1C 7.8 (H) 02/07/2012    Assessment & Plan    1.  ***   Lenna Sciara, NP 12/17/2021, 5:56 AM

## 2021-12-17 NOTE — Telephone Encounter (Signed)
Pt has appt 8-10 at [facility] for evaluation

## 2021-12-18 ENCOUNTER — Other Ambulatory Visit: Payer: Self-pay

## 2021-12-18 NOTE — Patient Outreach (Signed)
  Care Coordination   Follow Up Visit Note   12/18/2021 Name: Michelle Harding MRN: [id number] DOB: 03-29-1951  Michelle Harding is a 71 y.o. year old female who sees Nicholos Johns, MD for primary care. I  placed call back to patient and no answer. Left a message.  What matters to the patients health and wellness today?  Placed call back to patient to try to obtain consent and no answer. Left a message     SDOH assessments and interventions completed:  No     Care Coordination Interventions Activated:  No  Care Coordination Interventions:  No, not indicated   Follow up plan:  Will call MD office to report case and findings.    Encounter Outcome:  No Answer   Tomasa Rand, RN, BSN, CEN [place] Coordinator [phone]

## 2021-12-18 NOTE — Patient Outreach (Signed)
  Care Coordination   Follow Up Visit Note   12/18/2021 Name: SIANI UTKE MRN: [id number] DOB: 1950/06/08  ARACELIA BRINSON is a 71 y.o. year old female who sees Nicholos Johns, MD for primary care. I spoke with  [name] by phone today  What matters to the patients health and wellness today?  Incoming call back from patient who reports she has spoken with Dr. Chipper Herb office. Reports BP is now 147/? ( She can't remember).  Reviewed again the need for consent. Patient has consented for me to call her back in 10 days to make sure she was able to change PCP's.      SDOH assessments and interventions completed:  No     Care Coordination Interventions Activated:  No  Care Coordination Interventions:  No, not indicated   Follow up plan:  Will call back in 10 days to confirm she has changed PCP's    Encounter Outcome:  Pt. Request to Call Back   Tomasa Rand, RN, BSN, CEN [place] Coordinator [phone]

## 2021-12-18 NOTE — Patient Outreach (Signed)
  Care Coordination   Follow Up Visit Note   12/18/2021 Name: Michelle Harding MRN: [id number] DOB: 10/05/1950  Michelle Harding is a 71 y.o. year old female who sees Nicholos Johns, MD for primary care. I engaged with Michelle Harding in the providers office today.   What matters to the patients health and wellness today?  Placed call to Dr. Chipper Herb office and spoke with nurse Erlene Quan.  Reviewed patients recent discharge from [facility] yesterday.  Reviewed BP reading for today and that patient reports headache.  Communicated that patient did not consent to program but that I am worried about her.  I inquired if office was doing TOC work and [place] states yes.  He states he will talk to Dr. Rica Records and he will also call the patient himself.     SDOH assessments and interventions completed:  No     Care Coordination Interventions Activated:  Yes  Care Coordination Interventions:  Yes, provided   Follow up plan: No further intervention required.   Encounter Outcome:  Pt. Visit Completed   Tomasa Rand, RN, BSN, CEN [place] Coordinator [phone]

## 2021-12-18 NOTE — Patient Outreach (Signed)
  Care Coordination   Outreach  Visit Note   12/18/2021 Harding: Michelle Harding MRN: [id number] DOB: 1950-05-17  Michelle Harding is a 71 y.o. year old female who sees Michelle Johns, MD for primary care. I spoke with  Michelle Harding by phone today  What matters to the patients health and wellness today?  Placed call to patient. No answer. Return call back from patient.  Explained [facility] care coordination program.  Patient reports to me that she got out of the [facility] yesterday after having a hypertensive crisis.  Reports " I was on a blood pressure medication drip"  Patient reports that she has a headache and her BP was 200/100 today. Reports that her son took her to the pharmacy and she picked up her medications.  Reviewed concern for stroke and complications.  Patient reports to me that she is changing MD's to [facility]. Reports she has an appointment on 12/24/2021.  Patient did not consent to program.   I review my concerns for patients current BP. Reviewed S/S of stroke, reviewed when to call 911.  Encouraged patient to call me back if she decides to participate in the care coordination program   SDOH assessments and interventions completed:  No     Care Coordination Interventions Activated:  Yes  Care Coordination Interventions:  Yes, provided   Follow up plan: No further intervention required.  Patient did not consent  Encounter Outcome:  Pt. Refused   Tomasa Rand, RN, BSN, CEN [facility] [employer] [phone]

## 2021-12-29 ENCOUNTER — Other Ambulatory Visit: Payer: Self-pay

## 2021-12-29 NOTE — Patient Outreach (Signed)
  Care Coordination   12/29/2021 Name: Michelle Harding MRN: [id number] DOB: 1951-04-14   Care Coordination Outreach Attempts:  An unsuccessful telephone outreach was attempted today to offer the patient information about available care coordination services as a benefit of their health plan.   Placed follow up call to patient post 10 days from initial outreach. No answer. Left a message requesting a call back.  Follow Up Plan:  No further outreach attempts will be made at this time. We have been unable to contact the patient to offer or enroll patient in care coordination services  Encounter Outcome:  No Answer  Care Coordination Interventions Activated:  No   Care Coordination Interventions:  No, not indicated    Tomasa Rand, RN, BSN, CEN [place] Coordinator [phone]

## 2022-04-05 ENCOUNTER — Encounter: Payer: Self-pay | Admitting: Gastroenterology

## 2022-04-19 ENCOUNTER — Other Ambulatory Visit: Payer: Self-pay

## 2022-04-21 ENCOUNTER — Ambulatory Visit: Payer: Medicare HMO | Attending: Cardiology | Admitting: Cardiology

## 2022-04-21 ENCOUNTER — Encounter: Payer: Self-pay | Admitting: Cardiology

## 2022-04-21 VITALS — BP 136/82 | HR 80 | Ht 66.5 in | Wt 245.6 lb

## 2022-04-21 DIAGNOSIS — I351 Nonrheumatic aortic (valve) insufficiency: Secondary | ICD-10-CM | POA: Diagnosis not present

## 2022-04-21 DIAGNOSIS — I10 Essential (primary) hypertension: Secondary | ICD-10-CM

## 2022-04-21 DIAGNOSIS — E782 Mixed hyperlipidemia: Secondary | ICD-10-CM | POA: Diagnosis not present

## 2022-04-21 DIAGNOSIS — I2510 Atherosclerotic heart disease of native coronary artery without angina pectoris: Secondary | ICD-10-CM | POA: Diagnosis not present

## 2022-04-21 NOTE — Progress Notes (Unsigned)
Cardiology Office Note:    Date:  04/21/2022   ID:  Michelle Harding, DOB 08/10/1950, MRN [id number]  PCP:  Nicholos Johns, MD  Cardiologist:  Jenne Campus, MD    Referring MD: Nicholos Johns, MD   Chief Complaint  Patient presents with   Hypertension    History of Present Illness:    Michelle Harding is a 70 y.o. female  with past medical history significant for coronary artery disease, cardiac catheterization done in 2013 however showed only luminal disease. She did have a stress test done in the middle of 2012 which showed no evidence of ischemia she also got history of multiple risk factors for coronary artery disease namely hypertension, dyslipidemia, diabetes.  She comes today to my office for follow-up she did have some back surgery and she is dissatisfied with the procedure still have a lot of pain.  She describes also to have some chest pain left side going towards her arm lasting 4 days straight now she does not have any.  Described to have fatigue tiredness and   Past Medical History:  Diagnosis Date   Aortic regurgitation 08/06/2015   Asthma    Bradycardia by electrocardiogram 02/10/2012   Chronic constipation    Community acquired pneumonia 02/07/2012   COPD (chronic obstructive pulmonary disease) (HCC)    Coronary artery disease involving native coronary artery of native heart without angina pectoris 04/24/2015   Luminal disease by cardiac catheterization from 2013   Diabetes mellitus ([place]) 02/07/2012   Diabetes, polyneuropathy ([place]) 08/06/2015   Dyslipidemia 04/24/2015   Gout attack 02/07/2012   Heart disease    Hemorrhoids    Herniated nucleus pulposus, L5-S1, right 08/06/2015   Formatting of this note might be different from the original. Right L5 and S1 roots.  L4-5, possible left L4 root   Hyperlipidemia 08/06/2015   Hypertension    Hypothyroidism 08/06/2015   IBS (irritable bowel syndrome)    Lumbar radiculopathy 08/06/2015   Formatting of this note might be  different from the original. Right   Meningioma ([place])    Morbid obesity ([place]) 03/31/2020   Osteoarthritis    Osteoarthritis    Ovarian cyst    Type 2 diabetes mellitus with diabetic polyneuropathy, with long-term current use of insulin ([place]) 08/06/2015    Past Surgical History:  Procedure Laterality Date   CATARACT EXTRACTION Left 03/2013   CATARACT EXTRACTION Right 05/2013   CHOLECYSTECTOMY  1983   COLONOSCOPY  04/27/2012   Colonic polyp status post polypectomy. Minimal sigmoid diverticulosis. Small internal hemorrhoids.    ESOPHAGOGASTRODUODENOSCOPY  06/24/2010   Small hiatal hernia   HAND SURGERY     HERNIA REPAIR     With mesh   KNEE SURGERY  2012   OOPHORECTOMY Left 07/10/2015   OOPHORECTOMY Right 1989   ROTATOR CUFF REPAIR     TONSILLECTOMY     TONSILLECTOMY AND ADENOIDECTOMY      Current Medications: Current Meds  Medication Sig   allopurinol (ZYLOPRIM) 300 MG tablet Take 300 mg by mouth as needed (arthritis).   amLODipine (NORVASC) 2.5 MG tablet Take 1 tablet by mouth daily.   aspirin EC 81 MG tablet Take 1 tablet by mouth daily.   carvedilol (COREG) 3.125 MG tablet Take 3.125 mg by mouth 2 (two) times daily with a meal.   Cholecalciferol (VITAMIN D3) 1.25 MG (50000 UT) CAPS Take 1 capsule by mouth once a week.   colchicine 0.6 MG tablet Take 0.6 mg by mouth as needed (Gout).  dapagliflozin propanediol (FARXIGA) 10 MG TABS tablet Take 10 mg by mouth daily.    insulin degludec (TRESIBA) 200 UNIT/ML FlexTouch Pen Inject 45 Units into the skin daily.   levothyroxine (SYNTHROID, LEVOTHROID) 25 MCG tablet Take 50 mcg by mouth every other day.   montelukast (SINGULAIR) 10 MG tablet Take 10 mg by mouth at bedtime.   nitroGLYCERIN (NITROSTAT) 0.4 MG SL tablet Place 1 tablet under the tongue as needed for chest pain.   nystatin cream (MYCOSTATIN) Apply 1 application  topically as needed for dry skin.   OZEMPIC, 1 MG/DOSE, 4 MG/3ML SOPN Inject 1 mg into the skin once a week.    permethrin (ELIMITE) 5 % cream Apply 1 application topically once a week.   predniSONE (DELTASONE) 5 MG tablet Take 5 mg by mouth as needed (Arthritis).   REPATHA SURECLICK 865 MG/ML SOAJ Inject 140 mg as directed every 14 (fourteen) days.   torsemide (DEMADEX) 5 MG tablet Take 1 tablet (5 mg total) by mouth daily.   valsartan (DIOVAN) 160 MG tablet Take 160 mg by mouth daily.   [DISCONTINUED] albuterol (PROVENTIL HFA;VENTOLIN HFA) 108 (90 BASE) MCG/ACT inhaler Inhale 2 puffs into the lungs every 6 (six) hours as needed for wheezing or shortness of breath. For shortness of breath   [DISCONTINUED] DULoxetine (CYMBALTA) 60 MG capsule Take 60 mg by mouth daily.   [DISCONTINUED] Flaxseed, Linseed, (GNP FLAX SEED OIL) 1000 MG CAPS Take 1,000 mg by mouth daily.   [DISCONTINUED] fluticasone (FLONASE) 50 MCG/ACT nasal spray Place 2 sprays into both nostrils daily.   [DISCONTINUED] furosemide (LASIX) 20 MG tablet Take 20 mg by mouth daily.   [DISCONTINUED] hydrALAZINE (APRESOLINE) 25 MG tablet Take 1 tablet (25 mg total) by mouth 3 (three) times daily.   [DISCONTINUED] metoprolol succinate (TOPROL-XL) 100 MG 24 hr tablet Take 100 mg by mouth daily.   [DISCONTINUED] omeprazole (PRILOSEC) 20 MG capsule Take 20 mg by mouth daily.   [DISCONTINUED] sertraline (ZOLOFT) 100 MG tablet Take 100 mg by mouth daily.   [DISCONTINUED] topiramate (TOPAMAX) 25 MG tablet Take 25 mg by mouth 2 (two) times daily.   [DISCONTINUED] TOUJEO SOLOSTAR 300 UNIT/ML Solostar Pen Inject into the skin daily.   [DISCONTINUED] triamcinolone (KENALOG) 0.025 % cream Apply 1 application topically 2 (two) times daily.   [DISCONTINUED] TRULICITY 1.5 HQ/4.6NG SOPN Inject 0.5 mg into the skin once a week.   [DISCONTINUED] valsartan-hydrochlorothiazide (DIOVAN-HCT) 320-25 MG tablet Take 1 tablet by mouth daily.   [DISCONTINUED] Vitamin D, Ergocalciferol, (DRISDOL) 1.25 MG (50000 UNIT) CAPS capsule Take 50,000 Units by mouth once a week.    [DISCONTINUED] vitamin E 1000 UNIT capsule Take 1,000 Units by mouth daily.     Allergies:   Statins, Furosemide, Rosuvastatin, Hydrocodone-acetaminophen, Aspirin, and Vicodin [hydrocodone-acetaminophen]   Social History   Socioeconomic History   Marital status: Divorced    Spouse name: Not on file   Number of children: 7   Years of education: Not on file   Highest education level: Not on file  Occupational History   Occupation: Retired   Tobacco Use   Smoking status: Never   Smokeless tobacco: Never   Tobacco comments:    did smoke from time to time but stopped 2018  Vaping Use   Vaping Use: Never used  Substance and Sexual Activity   Alcohol use: Yes    Comment: ocassionally   Drug use: Not Currently    Types: Marijuana    Comment: use was in the past  Sexual activity: Never  Other Topics Concern   Not on file  Social History Narrative   Not on file   Social Determinants of Health   Financial Resource Strain: Not on file  Food Insecurity: Not on file  Transportation Needs: Not on file  Physical Activity: Not on file  Stress: Not on file  Social Connections: Not on file     Family History: The patient's family history includes Diabetes in her paternal grandmother; Diabetes type II in her father and son; Hypertension in her daughter and paternal grandmother. There is no history of Colon cancer, Esophageal cancer, or Breast cancer. ROS:   Please see the history of present illness.    All 14 point review of systems negative except as described per history of present illness  EKGs/Labs/Other Studies Reviewed:      Recent Labs: No results found for requested labs within last 365 days.  Recent Lipid Panel No results found for: "CHOL", "TRIG", "HDL", "CHOLHDL", "VLDL", "LDLCALC", "LDLDIRECT"  Physical Exam:    VS:  BP 136/82 (BP Location: Left Arm, Patient Position: Sitting)   Pulse 80   Ht 5' 6.5" (1.689 m)   Wt 245 lb 9.6 oz (111.4 kg)   SpO2 92%   BMI  39.05 kg/m     Wt Readings from Last 3 Encounters:  04/21/22 245 lb 9.6 oz (111.4 kg)  01/14/21 245 lb (111.1 kg)  07/01/20 254 lb (115.2 kg)     GEN:  Well nourished, well developed in no acute distress HEENT: Normal NECK: No JVD; No carotid bruits LYMPHATICS: No lymphadenopathy CARDIAC: RRR, no murmurs, no rubs, no gallops RESPIRATORY:  Clear to auscultation without rales, wheezing or rhonchi  ABDOMEN: Soft, non-tender, non-distended MUSCULOSKELETAL:  No edema; No deformity  SKIN: Warm and dry LOWER EXTREMITIES: no swelling NEUROLOGIC:  Alert and oriented x 3 PSYCHIATRIC:  Normal affect   ASSESSMENT:    1. Coronary artery disease involving native coronary artery of native heart without angina pectoris   2. Nonrheumatic aortic valve insufficiency   3. Primary hypertension   4. Mixed hyperlipidemia    PLAN:    In order of problems listed above:  Coronary disease stable from that point review.  Symptoms described very atypical. Aortic valve insufficiency only mild. Essential hyper tension blood pressure well-controlled Dyslipidemia she is on PCSK9 agent.  Her LDL at 36 HDL 54 we will continue present management   Medication Adjustments/Labs and Tests Ordered: Current medicines are reviewed at length with the patient today.  Concerns regarding medicines are outlined above.  Orders Placed This Encounter  Procedures   EKG 12-Lead   Medication changes: No orders of the defined types were placed in this encounter.   Signed, Park Liter, MD, [facility] 04/21/2022 4:33 PM    [place]

## 2022-04-21 NOTE — Patient Instructions (Signed)

## 2022-05-10 DIAGNOSIS — I6529 Occlusion and stenosis of unspecified carotid artery: Secondary | ICD-10-CM

## 2022-05-10 HISTORY — DX: Occlusion and stenosis of unspecified carotid artery: I65.29

## 2022-05-13 ENCOUNTER — Ambulatory Visit: Payer: Medicare HMO | Admitting: Gastroenterology

## 2022-05-13 ENCOUNTER — Encounter: Payer: Self-pay | Admitting: Gastroenterology

## 2022-05-13 VITALS — BP 152/92 | HR 82 | Ht 66.0 in | Wt 248.0 lb

## 2022-05-13 DIAGNOSIS — R1013 Epigastric pain: Secondary | ICD-10-CM

## 2022-05-13 DIAGNOSIS — K5909 Other constipation: Secondary | ICD-10-CM | POA: Diagnosis not present

## 2022-05-13 DIAGNOSIS — Z860100 Personal history of colon polyps, unspecified: Secondary | ICD-10-CM | POA: Insufficient documentation

## 2022-05-13 DIAGNOSIS — Z794 Long term (current) use of insulin: Secondary | ICD-10-CM

## 2022-05-13 DIAGNOSIS — K219 Gastro-esophageal reflux disease without esophagitis: Secondary | ICD-10-CM

## 2022-05-13 DIAGNOSIS — E119 Type 2 diabetes mellitus without complications: Secondary | ICD-10-CM | POA: Diagnosis not present

## 2022-05-13 HISTORY — DX: Personal history of colonic polyps: Z86.010

## 2022-05-13 HISTORY — DX: Epigastric pain: R10.13

## 2022-05-13 HISTORY — DX: Gastro-esophageal reflux disease without esophagitis: K21.9

## 2022-05-13 MED ORDER — NA SULFATE-K SULFATE-MG SULF 17.5-3.13-1.6 GM/177ML PO SOLN: 1.0000 | Freq: Once | ORAL | 0 refills | Status: AC

## 2022-05-13 NOTE — Progress Notes (Signed)
    05/13/2022 Michelle Harding [id number] 05/18/1950     HISTORY OF PRESENT ILLNESS: This is a 71-year-old female who is a patient of Dr. Gupta's.  She was last seen here by him in October 2019.  She has chronic constipation/IBS-C, GERD, chronic abdominal pain with history of multiple abdominal surgeries and hernia repairs, history of colonic polyps (colon 04/2012, colonic polyp status post polypectomy, mild sigmoid diverticulosis, internal hemorrhoids, otherwise normal to TI).  She is here today with complaints of abdominal pain and constipation.  She says that her stomach is "always acting up".  She says that she gets nauseated, but does not vomit.  Has pain mostly all over the right side of her abdomen.  Not taking anything to help her bowels right now, but had taken MiraLAX and Trulance in the past without improvement.  Actually the MiraLAX was too harsh and Trulance did not help.  Tells me she was at [facility] in August or September had a CT scan and they told her she had some type of stomach tear and hernia (will try to get those records).  She complains of a lot of bloating and fullness sensation on the right side of her abdomen.  Overall she is a poor historian.         Past Medical History:  Diagnosis Date   Aortic regurgitation 08/06/2015   Asthma     Bradycardia by electrocardiogram 02/10/2012   Chronic constipation     Community acquired pneumonia 02/07/2012   COPD (chronic obstructive pulmonary disease) (HCC)     Coronary artery disease involving native coronary artery of native heart without angina pectoris 04/24/2015    Luminal disease by cardiac catheterization from 2013   Diabetes mellitus (HCC) 02/07/2012   Diabetes, polyneuropathy (HCC) 08/06/2015   Dyslipidemia 04/24/2015   Gout attack 02/07/2012   Heart disease     Hemorrhoids     Herniated nucleus pulposus, L5-S1, right 08/06/2015    Formatting of this note might be different from the original. Right L5 and S1  roots.  L4-5, possible left L4 root   High blood pressure     Hyperlipidemia 08/06/2015   Hypertension     Hypothyroidism 08/06/2015   IBS (irritable bowel syndrome)     Lumbar radiculopathy 08/06/2015    Formatting of this note might be different from the original. Right   Meningioma (HCC)     Morbid obesity (HCC) 03/31/2020   Osteoarthritis     Osteoarthritis     Ovarian cyst     Type 2 diabetes mellitus with diabetic polyneuropathy, with long-term current use of insulin (HCC) 08/06/2015         Past Surgical History:  Procedure Laterality Date   CATARACT EXTRACTION Left 03/2013   CATARACT EXTRACTION Right 05/2013   CHOLECYSTECTOMY   1983   COLONOSCOPY   04/27/2012    Colonic polyp status post polypectomy. Minimal sigmoid diverticulosis. Small internal hemorrhoids.    ESOPHAGOGASTRODUODENOSCOPY   06/24/2010    Small hiatal hernia   HAND SURGERY       HERNIA REPAIR        With mesh   KNEE SURGERY   2012   OOPHORECTOMY Left 07/10/2015   OOPHORECTOMY Right 1989   ROTATOR CUFF REPAIR       TONSILLECTOMY       TONSILLECTOMY AND ADENOIDECTOMY         reports that she has never smoked. She has never used smokeless tobacco. She reports current   alcohol use. She reports that she does not currently use drugs after having used the following drugs: Marijuana. family history includes Diabetes in her paternal grandmother; Diabetes type II in her father and son; Hypertension in her daughter and paternal grandmother.      Allergies  Allergen Reactions   Statins Swelling   Furosemide Swelling   Rosuvastatin Swelling and Other (See Comments)   Hydrocodone-Acetaminophen Other (See Comments)   Aspirin        Patient says it hurts her stomach, has to be coaed   Vicodin [Hydrocodone-Acetaminophen]        Patient says Vicodin makes her itch            Outpatient Encounter Medications as of 05/13/2022  Medication Sig   allopurinol (ZYLOPRIM) 300 MG tablet Take 300 mg by mouth as needed  (arthritis).   amLODipine (NORVASC) 2.5 MG tablet Take 1 tablet by mouth daily.   aspirin EC 81 MG tablet Take 1 tablet by mouth daily.   carvedilol (COREG) 3.125 MG tablet Take 3.125 mg by mouth 2 (two) times daily with a meal.   Cholecalciferol (VITAMIN D3) 1.25 MG (50000 UT) CAPS Take 1 capsule by mouth once a week.   colchicine 0.6 MG tablet Take 0.6 mg by mouth as needed (Gout).   dapagliflozin propanediol (FARXIGA) 10 MG TABS tablet Take 10 mg by mouth daily.    insulin degludec (TRESIBA) 200 UNIT/ML FlexTouch Pen Inject 45 Units into the skin daily.   levothyroxine (SYNTHROID, LEVOTHROID) 25 MCG tablet Take 50 mcg by mouth every other day.   montelukast (SINGULAIR) 10 MG tablet Take 10 mg by mouth at bedtime.   nitroGLYCERIN (NITROSTAT) 0.4 MG SL tablet Place 1 tablet under the tongue as needed for chest pain.   nystatin cream (MYCOSTATIN) Apply 1 application  topically as needed for dry skin.   OZEMPIC, 1 MG/DOSE, 4 MG/3ML SOPN Inject 1 mg into the skin once a week.   permethrin (ELIMITE) 5 % cream Apply 1 application topically once a week.   predniSONE (DELTASONE) 5 MG tablet Take 5 mg by mouth as needed (Arthritis).   REPATHA SURECLICK 140 MG/ML SOAJ Inject 140 mg as directed every 14 (fourteen) days.   valsartan (DIOVAN) 160 MG tablet Take 160 mg by mouth daily.   torsemide (DEMADEX) 5 MG tablet Take 1 tablet (5 mg total) by mouth daily.    No facility-administered encounter medications on file as of 05/13/2022.        REVIEW OF SYSTEMS  : All other systems reviewed and negative except where noted in the History of Present Illness.     PHYSICAL EXAM: BP (!) 152/92   Pulse 82   Ht 5' 6" (1.676 m)   Wt 248 lb (112.5 kg)   BMI 40.03 kg/m  General: Well developed female in no acute distress Head: Normocephalic and atraumatic Eyes  Sclerae anicteric, conjunctiva pink. Ears: Normal auditory acuity Lungs: Clear throughout to auscultation; no W/R/R. Heart: Regular rate and  rhythm; no M/R/G. Abdomen: Soft, multiple scars noted causing asymmetry of the abdomen, diastasis recti also noted.  Upper abdominal TTP. Rectal:  Will be done at the time of colonoscopy. Musculoskeletal: Symmetrical with no gross deformities  Skin: No lesions on visible extremities Extremities: No edema  Neurological: Alert oriented x 4, grossly non-focal Psychological:  Alert and cooperative. Normal mood and affect   ASSESSMENT AND PLAN: *GERD and epigastric abdominal pain:  Will plan for EGD with Dr. Gupta.   *Chronic   constipation:  Multifactorial.  Has tried Miralax and Trulance before.  Will try Linzess 290 mcg daily.  Samples were given to start. *History of colonic polyps (colon 04/2012, colonic polyp status post polypectomy, mild sigmoid diverticulosis, internal hemorrhoids, otherwise normal to TI).  Will plan for colonoscopy with 2 day bowel prep.  *IDDM:   Insulin will be adjusted prior to endoscopic procedure per protocol. Will resume normal dosing after procedure.    Dr. Gupta had wanted to do EGD and colonoscopy in early 2020 after he saw her last.     CC:  Uppin, Nina, MD 

## 2022-05-13 NOTE — Patient Instructions (Addendum)
_______________________________________________________  If you are age 73 or older, your body mass index should be between 23-30. Your Body mass index is 40.03 kg/m. If this is out of the aforementioned range listed, please consider follow up with your Primary Care Provider.  If you are age 64 or younger, your body mass index should be between 19-25. Your Body mass index is 40.03 kg/m. If this is out of the aformentioned range listed, please consider follow up with your Primary Care Provider.   ________________________________________________________  The [facility] providers would like to encourage you to use [facility] to communicate with providers for non-urgent requests or questions.  Due to long hold times on the telephone, sending your provider a message by [facility] may be a faster and more efficient way to get a response.  Please allow 48 business hours for a response.  Please remember that this is for non-urgent requests.  _______________________________________________________  We have given you samples of the following medication to take: Linzess 290 daily  You have been scheduled for a colonoscopy. Please follow written instructions given to you at your visit today.  Please pick up your prep supplies at the pharmacy within the next 1-3 days. If you use inhalers (even only as needed), please bring them with you on the day of your procedure.

## 2022-05-16 NOTE — Progress Notes (Signed)
Agree with assessment/plan.  Raj Ruble Buttler, MD [facility] [phone]  

## 2022-06-15 ENCOUNTER — Emergency Department (HOSPITAL_COMMUNITY): Payer: 59

## 2022-06-15 ENCOUNTER — Other Ambulatory Visit: Payer: Self-pay

## 2022-06-15 ENCOUNTER — Emergency Department (HOSPITAL_COMMUNITY)
Admission: EM | Admit: 2022-06-15 | Discharge: 2022-06-15 | Disposition: A | Discharge status: Home / Self Care | Payer: 59 | Attending: Emergency Medicine | Admitting: Emergency Medicine

## 2022-06-15 DIAGNOSIS — M5416 Radiculopathy, lumbar region: Secondary | ICD-10-CM | POA: Insufficient documentation

## 2022-06-15 DIAGNOSIS — E039 Hypothyroidism, unspecified: Secondary | ICD-10-CM | POA: Insufficient documentation

## 2022-06-15 DIAGNOSIS — Z79899 Other long term (current) drug therapy: Secondary | ICD-10-CM | POA: Diagnosis not present

## 2022-06-15 DIAGNOSIS — Z7982 Long term (current) use of aspirin: Secondary | ICD-10-CM | POA: Diagnosis not present

## 2022-06-15 DIAGNOSIS — I10 Essential (primary) hypertension: Secondary | ICD-10-CM | POA: Insufficient documentation

## 2022-06-15 DIAGNOSIS — Z794 Long term (current) use of insulin: Secondary | ICD-10-CM | POA: Diagnosis not present

## 2022-06-15 DIAGNOSIS — R1011 Right upper quadrant pain: Secondary | ICD-10-CM | POA: Insufficient documentation

## 2022-06-15 DIAGNOSIS — R109 Unspecified abdominal pain: Secondary | ICD-10-CM

## 2022-06-15 LAB — COMPREHENSIVE METABOLIC PANEL
ALT: 15 U/L (ref 0–44)
AST: 21 U/L (ref 15–41)
Albumin: 4.3 g/dL (ref 3.5–5.0)
Alkaline Phosphatase: 43 U/L (ref 38–126)
Anion gap: 12 (ref 5–15)
BUN: 11 mg/dL (ref 8–23)
CO2: 28 mmol/L (ref 22–32)
Calcium: 9.6 mg/dL (ref 8.9–10.3)
Chloride: 100 mmol/L (ref 98–111)
Creatinine, Ser: 0.87 mg/dL (ref 0.44–1.00)
GFR, Estimated: >60 mL/min (ref >60)
Glucose, Bld: 113 mg/dL — ABNORMAL HIGH (ref 70–99)
Potassium: 3.7 mmol/L (ref 3.5–5.1)
Sodium: 140 mmol/L (ref 135–145)
Total Bilirubin: 1.2 mg/dL (ref 0.3–1.2)
Total Protein: 7.6 g/dL (ref 6.5–8.1)

## 2022-06-15 LAB — CBC WITH DIFFERENTIAL/PLATELET
Abs Immature Granulocytes: 0.04 10*3/uL (ref 0.00–0.07)
Basophils Absolute: 0.1 10*3/uL (ref 0.0–0.1)
Basophils Relative: 1 %
Eosinophils Absolute: 0.1 10*3/uL (ref 0.0–0.5)
Eosinophils Relative: 1 %
HCT: 40.5 % (ref 36.0–46.0)
Hemoglobin: 12.8 g/dL (ref 12.0–15.0)
Immature Granulocytes: 0 %
Lymphocytes Relative: 15 %
Lymphs Abs: 1.7 10*3/uL (ref 0.7–4.0)
MCH: 29.4 pg (ref 26.0–34.0)
MCHC: 31.6 g/dL (ref 30.0–36.0)
MCV: 93.1 fL (ref 80.0–100.0)
Monocytes Absolute: 0.9 10*3/uL (ref 0.1–1.0)
Monocytes Relative: 9 %
Neutro Abs: 8.2 10*3/uL — ABNORMAL HIGH (ref 1.7–7.7)
Neutrophils Relative %: 74 %
Platelets: 327 10*3/uL (ref 150–400)
RBC: 4.35 MIL/uL (ref 3.87–5.11)
RDW: 14.2 % (ref 11.5–15.5)
WBC: 11 10*3/uL — ABNORMAL HIGH (ref 4.0–10.5)
nRBC: 0 % (ref 0.0–0.2)

## 2022-06-15 LAB — URINALYSIS, ROUTINE W REFLEX MICROSCOPIC
Bilirubin Urine: NEGATIVE
Glucose, UA: NEGATIVE mg/dL
Hgb urine dipstick: NEGATIVE
Ketones, ur: NEGATIVE mg/dL
Leukocytes,Ua: NEGATIVE
Nitrite: NEGATIVE
Protein, ur: NEGATIVE mg/dL
Specific Gravity, Urine: 1.017 (ref 1.005–1.030)
pH: 6 (ref 5.0–8.0)

## 2022-06-15 LAB — LIPASE, BLOOD: Lipase: 33 U/L (ref 11–51)

## 2022-06-15 MED ORDER — TRAMADOL HCL 50 MG PO TABS: 50.0000 mg | ORAL_TABLET | Freq: Four times a day (QID) | ORAL | 0 refills | Status: AC | PRN

## 2022-06-15 MED ORDER — IOHEXOL 300 MG/ML  SOLN
100.0000 mL | Freq: Once | INTRAMUSCULAR | Status: AC | PRN
  Administered 2022-06-15: 100 mL via INTRAVENOUS

## 2022-06-15 MED ORDER — TRAMADOL HCL 50 MG PO TABS
50.0000 mg | ORAL_TABLET | Freq: Once | ORAL | Status: AC
  Administered 2022-06-15: 50 mg via ORAL
  Filled 2022-06-15: qty 1

## 2022-06-15 NOTE — Discharge Instructions (Addendum)
Take tramadol for pain

## 2022-06-15 NOTE — ED Triage Notes (Signed)
Pt reports right abd pain that radiates to back. Hx hernias

## 2022-06-15 NOTE — ED Provider Notes (Signed)
[facility] Provider Note   CSN: [id number] Arrival date & time: 06/15/22  1605     History  Chief Complaint  Patient presents with   Abdominal Pain    Michelle Harding is a 72 y.o. female hx of HTN, hypothyroidism, multiple abdominal surgeries, here presenting with abdominal pain.  Patient states that she been down and pick up something and had sudden onset of right lower quadrant pain.  She states that she bent down again had another episode of pain.  She was concerned that she may have a bowel obstruction.  She had no bowel movement today.  She states that she has a history of bowel obstruction in the past.  The history is provided by the patient.       Home Medications Prior to Admission medications   Medication Sig Start Date End Date Taking? Authorizing Provider  allopurinol (ZYLOPRIM) 300 MG tablet Take 300 mg by mouth as needed (arthritis). 08/25/19   [provider]  amLODipine (NORVASC) 2.5 MG tablet Take 1 tablet by mouth daily.    [provider]  aspirin EC 81 MG tablet Take 1 tablet by mouth daily.    [provider]  carvedilol (COREG) 3.125 MG tablet Take 3.125 mg by mouth 2 (two) times daily with a meal. 12/17/21   [provider]  Cholecalciferol (VITAMIN D3) 1.25 MG (50000 UT) CAPS Take 1 capsule by mouth once a week. 06/09/20   [provider]  colchicine 0.6 MG tablet Take 0.6 mg by mouth as needed (Gout). 04/12/19   [provider]  dapagliflozin propanediol (FARXIGA) 10 MG TABS tablet Take 10 mg by mouth daily.     [provider]  insulin degludec (TRESIBA) 200 UNIT/ML FlexTouch Pen Inject 45 Units into the skin daily. 09/29/20   [provider]  levothyroxine (SYNTHROID, LEVOTHROID) 25 MCG tablet Take 50 mcg by mouth every other day. 05/21/14   [provider]  montelukast (SINGULAIR) 10 MG tablet Take 10 mg by mouth at bedtime.    [provider]  nitroGLYCERIN (NITROSTAT) 0.4 MG SL tablet Place 1 tablet under the tongue as needed for chest pain.    [provider]  nystatin cream (MYCOSTATIN) Apply 1 application  topically as needed for dry skin. 08/28/19   [provider]  OZEMPIC, 1 MG/DOSE, 4 MG/3ML SOPN Inject 1 mg into the skin once a week. 04/02/22   [provider]  permethrin (ELIMITE) 5 % cream Apply 1 application topically once a week. 04/15/19   [provider]  predniSONE (DELTASONE) 5 MG tablet Take 5 mg by mouth as needed (Arthritis). 03/30/19   [provider]  REPATHA SURECLICK 726 MG/ML SOAJ Inject 140 mg as directed every 14 (fourteen) days. 08/31/19   [provider]  torsemide (DEMADEX) 5 MG tablet Take 1 tablet (5 mg total) by mouth daily. 07/01/20 04/21/22  Park Liter, MD  valsartan (DIOVAN) 160 MG tablet Take 160 mg by mouth daily. 12/17/21   [provider]      Allergies    Statins, Furosemide, Rosuvastatin, Hydrocodone-acetaminophen, Aspirin, and Vicodin [hydrocodone-acetaminophen]    Review of Systems   Review of Systems  Gastrointestinal:  Positive for abdominal pain.  All other systems reviewed and are negative.   Physical Exam Updated Vital Signs BP (!) 174/86 (BP Location: Left Arm)   Pulse 84   Temp 99 F (37.2 C) (Oral)   Resp 18  Ht '5\' 6"'$  (1.676 m)   Wt 112.5 kg   SpO2 100%   BMI 40.03 kg/m  Physical Exam Vitals and nursing note reviewed.  Constitutional:      Comments: Uncomfortable, chronically ill but not acutely ill  HENT:     Head: Normocephalic.  Eyes:     Extraocular Movements: Extraocular movements intact.     Pupils: Pupils are equal, round, and reactive to light.  Cardiovascular:     Rate and Rhythm: Normal rate and regular rhythm.  Pulmonary:     Effort: Pulmonary effort is normal.     Breath sounds: Normal breath sounds.  Abdominal:     General: Abdomen is flat.     Comments: Mild  right upper quadrant tenderness and no rebound or guarding  Skin:    General: Skin is warm.     Capillary Refill: Capillary refill takes less than 2 seconds.  Neurological:     General: No focal deficit present.     Mental Status: She is alert and oriented to person, place, and time.  Psychiatric:        Mood and Affect: Mood normal.        Behavior: Behavior normal.     ED Results / Procedures / Treatments   Labs (all labs ordered are listed, but only abnormal results are displayed) Labs Reviewed  CBC WITH DIFFERENTIAL/PLATELET - Abnormal; Notable for the following components:      Result Value   WBC 11.0 (*)    Neutro Abs 8.2 (*)    All other components within normal limits  COMPREHENSIVE METABOLIC PANEL - Abnormal; Notable for the following components:   Glucose, Bld 113 (*)    All other components within normal limits  LIPASE, BLOOD  URINALYSIS, ROUTINE W REFLEX MICROSCOPIC    EKG None  Radiology CT Abdomen Pelvis W Contrast  Result Date: 06/15/2022 CLINICAL DATA:  Right lower quadrant pain. EXAM: CT ABDOMEN AND PELVIS WITH CONTRAST TECHNIQUE: Multidetector CT imaging of the abdomen and pelvis was performed using the standard protocol following bolus administration of intravenous contrast. RADIATION DOSE REDUCTION: This exam was performed according to the departmental dose-optimization program which includes automated exposure control, adjustment of the mA and/or kV according to patient size and/or use of iterative reconstruction technique. CONTRAST:  127m OMNIPAQUE IOHEXOL 300 MG/ML  SOLN COMPARISON:  February 22, 2018 FINDINGS: Lower chest: Wispy ground-glass subtle areas of consolidation in bilateral lung bases. Hepatobiliary: No focal liver abnormality is seen. Status post cholecystectomy. No biliary dilatation. Pancreas: Unremarkable. No pancreatic ductal dilatation or surrounding inflammatory changes. Spleen: Normal in size without focal abnormality. Adrenals/Urinary  Tract: Adrenal glands are unremarkable. Kidneys are normal, without renal calculi, focal lesion, or hydronephrosis. Bladder is unremarkable. Stomach/Bowel: Stomach is within normal limits. Appendix appears normal. No evidence of bowel wall thickening, distention, or inflammatory changes. Mild diverticulosis of the left colon. Small hiatal hernia. Vascular/Lymphatic: No significant vascular findings are present. No enlarged abdominal or pelvic lymph nodes. Reproductive: Uterus and bilateral adnexa are unremarkable. Other: No abdominal wall hernia or abnormality. No abdominopelvic ascites. Post ventral hernia repair. Musculoskeletal: Anterolisthesis of L4 on L5, mild. 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 IMPRESSION: 1. Wispy ground-glass subtle areas of consolidation in bilateral lung bases. This may represent early or resolving atypical/viral pneumonia. 2. Mild diverticulosis of the left colon without evidence of diverticulitis. 3. Small hiatal hernia. 4. 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 Electronically Signed   By: Fidela Salisbury M.D.   On: 06/15/2022 19:39    Procedures Procedures    Medications Ordered in ED Medications  traMADol (ULTRAM) tablet 50 mg (has no administration in time range)  iohexol (OMNIPAQUE) 300 MG/ML solution 100 mL (100 mLs Intravenous Contrast Given 06/15/22 1912)    ED Course/ Medical Decision Making/ A&P                             Medical Decision Making GUSSIE MURTON is a 72 y.o. female here presenting with right-sided abdominal pain.  Patient has history of multiple abdominal surgeries so consider ileus versus SBO.  Patient has no vomiting.  Also consider lumbar radiculopathy.  Patient has negative straight leg raise.  Patient is able to ambulate by herself and has normal strength bilateral legs and has no saddle anesthesia.  I do not think she has cauda equina  syndrome so we will hold off on MRI.  Plan to get CBC CMP and UA and CT abdomen pelvis.  8:20 PM Labs unremarkable. UA unremarkable. CT abdomen pelvis showed likely viral pneumonia. Patient just finished a course of antibiotics for pneumonia.  At this point, patient stable for discharge.  Patient does have degenerative disc disease L4-5 and L5-S1 and may have radiculopathy.  She can follow-up with neurosurgery outpatient.   Problems Addressed: Abdominal pain, unspecified abdominal location: acute illness or injury Lumbar radiculopathy: acute illness or injury  Amount and/or Complexity of Data Reviewed Labs: ordered. Decision-making details documented in ED Course. Radiology: ordered and independent interpretation performed. Decision-making details documented in ED Course.  Risk Prescription drug management.    Final Clinical Impression(s) / ED Diagnoses Final diagnoses:  None    Rx / DC Orders ED Discharge Orders     None         Drenda Freeze, MD 06/15/22 2022

## 2022-06-15 NOTE — ED Provider Triage Note (Signed)
Emergency Medicine Provider Triage Evaluation Note  Michelle Harding , a 72 y.o. female  was evaluated in triage.  Pt complains of right lower quadrant abdominal pain with radiation into the right lower back.  She states that she bent down to pick something up yesterday and noticed that he sharp and sudden pain.  He has remained constant since that time.  Reports nausea but no vomiting.  Has had a small bowel movement today.  States she has not passed any gas today but that this is normal for her.  Has had at least 5 diarrhea repairs.  Still has her appendix.  Denies urinary symptoms.  Also reports right-sided abdominal swelling  Review of Systems  Positive: As above Negative: As above  Physical Exam  BP (!) 189/85 (BP Location: Left Arm)   Pulse 92   Temp 99.2 F (37.3 C) (Oral)   Resp 16   Ht '5\' 6"'$  (1.676 m)   Wt 112.5 kg   SpO2 96%   BMI 40.03 kg/m  Gen:   Awake, no distress   Resp:  Normal effort  MSK:   Moves extremities without difficulty  Other:  Right-sided abdominal TTP  Medical Decision Making  Medically screening exam initiated at 4:32 PM.  Appropriate orders placed.  Michelle Harding was informed that the remainder of the evaluation will be completed by another provider, this initial triage assessment does not replace that evaluation, and the importance of remaining in the ED until their evaluation is complete.  Abdominal pain labs, CT abdomen pelvis   Roylene Reason, PA-C 06/15/22 1633

## 2022-06-29 ENCOUNTER — Encounter: Payer: Self-pay | Admitting: Gastroenterology

## 2022-06-30 ENCOUNTER — Telehealth: Payer: Self-pay | Admitting: Gastroenterology

## 2022-06-30 NOTE — Telephone Encounter (Signed)
Left message on machine to call back  

## 2022-06-30 NOTE — Telephone Encounter (Signed)
Patient is calling she is wondering if she can take the liquid dulcolax instead of the tablets. Please advise

## 2022-07-01 NOTE — Telephone Encounter (Signed)
Patient returned call

## 2022-07-01 NOTE — Telephone Encounter (Signed)
Left message on machine to call back  

## 2022-07-01 NOTE — Telephone Encounter (Signed)
The pt has been advised that she can use liquid dulcolax in the comparable dose. The pt has been advised of the information and verbalized understanding.

## 2022-07-07 ENCOUNTER — Encounter: Payer: Self-pay | Admitting: Gastroenterology

## 2022-07-07 ENCOUNTER — Ambulatory Visit (AMBULATORY_SURGERY_CENTER): Payer: 59 | Admitting: Gastroenterology

## 2022-07-07 VITALS — BP 185/94 | HR 73 | Temp 98.3°F | Resp 14 | Ht 66.0 in | Wt 248.0 lb

## 2022-07-07 DIAGNOSIS — R1319 Other dysphagia: Secondary | ICD-10-CM

## 2022-07-07 DIAGNOSIS — D128 Benign neoplasm of rectum: Secondary | ICD-10-CM | POA: Diagnosis not present

## 2022-07-07 DIAGNOSIS — D123 Benign neoplasm of transverse colon: Secondary | ICD-10-CM

## 2022-07-07 DIAGNOSIS — K2900 Acute gastritis without bleeding: Secondary | ICD-10-CM

## 2022-07-07 DIAGNOSIS — K219 Gastro-esophageal reflux disease without esophagitis: Secondary | ICD-10-CM

## 2022-07-07 DIAGNOSIS — Z09 Encounter for follow-up examination after completed treatment for conditions other than malignant neoplasm: Secondary | ICD-10-CM

## 2022-07-07 DIAGNOSIS — K208 Other esophagitis without bleeding: Secondary | ICD-10-CM

## 2022-07-07 DIAGNOSIS — Z86010 Personal history of colonic polyps: Secondary | ICD-10-CM

## 2022-07-07 DIAGNOSIS — K449 Diaphragmatic hernia without obstruction or gangrene: Secondary | ICD-10-CM

## 2022-07-07 MED ORDER — PANTOPRAZOLE SODIUM 40 MG PO TBEC: 40.0000 mg | DELAYED_RELEASE_TABLET | Freq: Every day | ORAL | 11 refills | Status: DC

## 2022-07-07 MED ORDER — SODIUM CHLORIDE 0.9 % IV SOLN: 500.0000 mL | Freq: Once | INTRAVENOUS | Status: DC

## 2022-07-07 NOTE — Op Note (Signed)
[facility] Patient Name: Michelle Harding Procedure Date: 07/07/2022 3:14 PM MRN: [id number] Endoscopist: Jackquline Denmark , MD, [id number] Age: 72 Referring MD:  Date of Birth: 30-Aug-1950 Gender: Female Account #: [id number] Procedure:                Colonoscopy Indications:              Screening for colorectal malignant neoplasm. Remote                            history of colonic polyps 2013. Chronic                            constipation. Negative CT Abdo/pelvis for any acute                            abnormalities Medicines:                Monitored Anesthesia Care Procedure:                Pre-Anesthesia Assessment:                           - Prior to the procedure, a History and Physical                            was performed, and patient medications and                            allergies were reviewed. The patient's tolerance of                            previous anesthesia was also reviewed. The risks                            and benefits of the procedure and the sedation                            options and risks were discussed with the patient.                            All questions were answered, and informed consent                            was obtained. Prior Anticoagulants: The patient has                            taken no anticoagulant or antiplatelet agents. ASA                            Grade Assessment: II - A patient with mild systemic                            disease. After reviewing the risks and benefits,  the patient was deemed in satisfactory condition to                            undergo the procedure.                           After obtaining informed consent, the colonoscope                            was passed under direct vision. Throughout the                            procedure, the patient's blood pressure, pulse, and                            oxygen saturations were monitored continuously. The                             CF [id number] was introduced through the anus                            and advanced to the 2 cm into the ileum. The                            colonoscopy was performed without difficulty. The                            patient tolerated the procedure well. The quality                            of the bowel preparation was good. The terminal                            ileum, ileocecal valve, appendiceal orifice, and                            rectum were photographed. Scope In: 3:30:47 PM Scope Out: 3:47:09 PM Scope Withdrawal Time: 0 hours 10 minutes 35 seconds  Total Procedure Duration: 0 hours 16 minutes 22 seconds  Findings:                 A 6 mm polyp was found in the mid rectum. The polyp                            was sessile. The polyp was removed with a cold                            snare. Resection and retrieval were complete.                           A few medium-mouthed diverticula were found in the                            sigmoid colon, transverse colon  and ascending colon.                           Non-bleeding internal hemorrhoids were found during                            retroflexion. The hemorrhoids were small and Grade                            I (internal hemorrhoids that do not prolapse).                           The terminal ileum appeared normal.                           The exam was otherwise without abnormality on                            direct and retroflexion views. The colon was highly                            redundant. Complications:            No immediate complications. Estimated Blood Loss:     Estimated blood loss: none. Impression:               - One 6 mm polyp in the mid rectum, removed with a                            cold snare. Resected and retrieved.                           - Mild pancolonic diverticulosis.                           - Non-bleeding internal hemorrhoids.                            - The examined portion of the ileum was normal.                           - The examination was otherwise normal on direct                            and retroflexion views. Recommendation:           - Patient has a contact number available for                            emergencies. The signs and symptoms of potential                            delayed complications were discussed with the                            patient. Return to normal activities tomorrow.  Written discharge instructions were provided to the                            patient.                           - Resume previous diet.                           - Continue present medications.                           - Await pathology results.                           - Repeat colonoscopy for surveillance based on                            pathology results.                           - Miralax 1 capful (17 grams) in 8 ounces of water                            PO daily.                           - The findings and recommendations were discussed                            with the patient's family. Jackquline Denmark, MD 07/07/2022 3:53:38 PM This report has been signed electronically.

## 2022-07-07 NOTE — Progress Notes (Signed)
Pt's states no medical or surgical changes since previsit or office visit. 

## 2022-07-07 NOTE — Op Note (Addendum)
[facility] Patient Name: Michelle Harding Procedure Date: 07/07/2022 3:14 PM MRN: [id number] Endoscopist: Jackquline Denmark , MD, [id number] Age: 72 Referring MD:  Date of Birth: 11-02-1950 Gender: Female Account #: [id number] Procedure:                Upper GI endoscopy Indications:              Epigastric abdominal pain, Dysphagia. Intermittent                            N/V. Note that the patient is on Ozempic. Medicines:                Monitored Anesthesia Care Procedure:                Pre-Anesthesia Assessment:                           - Prior to the procedure, a History and Physical                            was performed, and patient medications and                            allergies were reviewed. The patient's tolerance of                            previous anesthesia was also reviewed. The risks                            and benefits of the procedure and the sedation                            options and risks were discussed with the patient.                            All questions were answered, and informed consent                            was obtained. Prior Anticoagulants: The patient has                            taken no anticoagulant or antiplatelet agents. ASA                            Grade Assessment: III - A patient with severe                            systemic disease. After reviewing the risks and                            benefits, the patient was deemed in satisfactory                            condition to undergo the procedure.  After obtaining informed consent, the endoscope was                            passed under direct vision. Throughout the                            procedure, the patient's blood pressure, pulse, and                            oxygen saturations were monitored continuously. The                            Olympus Scope [id number] was introduced through the                            mouth,  and advanced to the second part of duodenum.                            The upper GI endoscopy was accomplished without                            difficulty. The patient tolerated the procedure                            well. Scope In: Scope Out: Findings:                 The lower third of the esophagus was mildly                            tortuous. Biopsies were obtained from the proximal                            and distal esophagus with cold forceps for                            histology of suspected eosinophilic esophagitis.                           LA Grade A (one or more mucosal breaks less than 5                            mm, not extending between tops of 2 mucosal folds)                            esophagitis with no bleeding was found 40 cm from                            the incisors. The scope was withdrawn. Dilation was                            performed with a Maloney dilator with mild  resistance at 50 Fr.                           A 2 cm hiatal hernia was present.                           Localized mild inflammation characterized by                            erythema was found in the gastric antrum. Biopsies                            were taken with a cold forceps for histology.                           The examined duodenum was normal. Complications:            No immediate complications. Estimated Blood Loss:     Estimated blood loss: none. Impression:               - Mild Presbyesophagus s/p esophageal dilatation                           - LA Grade A reflux esophagitis with no bleeding.                            Dilated.                           - 2 cm hiatal hernia.                           - Gastritis. Biopsied. Recommendation:           - Patient has a contact number available for                            emergencies. The signs and symptoms of potential                            delayed complications were discussed  with the                            patient. Return to normal activities tomorrow.                            Written discharge instructions were provided to the                            patient.                           - Post dilatation diet.                           - Use Protonix (pantoprazole) 40 mg PO daily #30,  11RF                           - If still with problems, solid-phase gastric                            emptying scan preferably off Ozempic                           - The findings and recommendations were discussed                            with the patient's family. Jackquline Denmark, MD 07/07/2022 3:49:49 PM This report has been signed electronically.

## 2022-07-07 NOTE — Patient Instructions (Signed)
Recommend using one capful (17g) of miralax daily Start Protonix 40 mg once a day   YOU HAD AN ENDOSCOPIC PROCEDURE TODAY AT [facility]:   Refer to the procedure report that was given to you for any specific questions about what was found during the examination.  If the procedure report does not answer your questions, please call your gastroenterologist to clarify.  If you requested that your care partner not be given the details of your procedure findings, then the procedure report has been included in a sealed envelope for you to review at your convenience later.  YOU SHOULD EXPECT: Some feelings of bloating in the abdomen. Passage of more gas than usual.  Walking can help get rid of the air that was put into your GI tract during the procedure and reduce the bloating. If you had a lower endoscopy (such as a colonoscopy or flexible sigmoidoscopy) you may notice spotting of blood in your stool or on the toilet paper. If you underwent a bowel prep for your procedure, you may not have a normal bowel movement for a few days.  Please Note:  You might notice some irritation and congestion in your nose or some drainage.  This is from the oxygen used during your procedure.  There is no need for concern and it should clear up in a day or so.  SYMPTOMS TO REPORT IMMEDIATELY:  Following lower endoscopy (colonoscopy or flexible sigmoidoscopy):  Excessive amounts of blood in the stool  Significant tenderness or worsening of abdominal pains  Swelling of the abdomen that is new, acute  Fever of 100F or higher  Following upper endoscopy (EGD)  Vomiting of blood or coffee ground material  New chest pain or pain under the shoulder blades  Painful or persistently difficult swallowing  New shortness of breath  Fever of 100F or higher  Black, tarry-looking stools  For urgent or emergent issues, a gastroenterologist can be reached at any hour by calling [phone]. Do not use MyChart  messaging for urgent concerns.    DIET:  Dilation diet today (see handout), but then you may proceed to your regular diet.  Drink plenty of fluids but you should avoid alcoholic beverages for 24 hours.  ACTIVITY:  You should plan to take it easy for the rest of today and you should NOT DRIVE or use heavy machinery until tomorrow (because of the sedation medicines used during the test).    FOLLOW UP: Our staff will call the number listed on your records the next business day following your procedure.  We will call around 7:15- 8:00 am to check on you and address any questions or concerns that you may have regarding the information given to you following your procedure. If we do not reach you, we will leave a message.     If any biopsies were taken you will be contacted by phone or by letter within the next 1-3 weeks.  Please call us at [phone] if you have not heard about the biopsies in 3 weeks.    SIGNATURES/CONFIDENTIALITY: You and/or your care partner have signed paperwork which will be entered into your electronic medical record.  These signatures attest to the fact that that the information above on your After Visit Summary has been reviewed and is understood.  Full responsibility of the confidentiality of this discharge information lies with you and/or your care-partner.

## 2022-07-07 NOTE — Progress Notes (Signed)
A and O x3. Report to RN. Tolerated MAC anesthesia well.Teeth unchanged after procedure. 

## 2022-07-07 NOTE — Progress Notes (Signed)
Called to room to assist during endoscopic procedure.  Patient ID and intended procedure confirmed with present staff. Received instructions for my participation in the procedure from the performing physician.  

## 2022-07-07 NOTE — Progress Notes (Signed)
05/13/2022 Michelle Harding [id number] [birth year]     HISTORY OF PRESENT ILLNESS: This is a 72 year old female who is a patient of Dr. Steve Rattler.  She was last seen here by him in October 2019.  She has chronic constipation/IBS-C, GERD, chronic abdominal pain with history of multiple abdominal surgeries and hernia repairs, history of colonic polyps (colon 04/2012, colonic polyp status post polypectomy, mild sigmoid diverticulosis, internal hemorrhoids, otherwise normal to TI).  She is here today with complaints of abdominal pain and constipation.  She says that her stomach is "always acting up".  She says that she gets nauseated, but does not vomit.  Has pain mostly all over the right side of her abdomen.  Not taking anything to help her bowels right now, but had taken MiraLAX and Trulance in the past without improvement.  Actually the MiraLAX was too harsh and Trulance did not help.  Tells me she was at [facility] in August or September had a CT scan and they told her she had some type of stomach tear and hernia (will try to get those records).  She complains of a lot of bloating and fullness sensation on the right side of her abdomen.  Overall she is a poor historian.         Past Medical History:  Diagnosis Date   Aortic regurgitation 08/06/2015   Asthma     Bradycardia by electrocardiogram 02/10/2012   Chronic constipation     Community acquired pneumonia 02/07/2012   COPD (chronic obstructive pulmonary disease) (HCC)     Coronary artery disease involving native coronary artery of native heart without angina pectoris 04/24/2015    Luminal disease by cardiac catheterization from 2013   Diabetes mellitus ([place]) 02/07/2012   Diabetes, polyneuropathy ([place]) 08/06/2015   Dyslipidemia 04/24/2015   Gout attack 02/07/2012   Heart disease     Hemorrhoids     Herniated nucleus pulposus, L5-S1, right 08/06/2015    Formatting of this note might be different from the original. Right L5 and S1  roots.  L4-5, possible left L4 root   High blood pressure     Hyperlipidemia 08/06/2015   Hypertension     Hypothyroidism 08/06/2015   IBS (irritable bowel syndrome)     Lumbar radiculopathy 08/06/2015    Formatting of this note might be different from the original. Right   Meningioma ([place])     Morbid obesity ([place]) 03/31/2020   Osteoarthritis     Osteoarthritis     Ovarian cyst     Type 2 diabetes mellitus with diabetic polyneuropathy, with long-term current use of insulin ([place]) 08/06/2015         Past Surgical History:  Procedure Laterality Date   CATARACT EXTRACTION Left 03/2013   CATARACT EXTRACTION Right 05/2013   CHOLECYSTECTOMY   1983   COLONOSCOPY   04/27/2012    Colonic polyp status post polypectomy. Minimal sigmoid diverticulosis. Small internal hemorrhoids.    ESOPHAGOGASTRODUODENOSCOPY   06/24/2010    Small hiatal hernia   HAND SURGERY       HERNIA REPAIR        With mesh   KNEE SURGERY   2012   OOPHORECTOMY Left 07/10/2015   OOPHORECTOMY Right 1989   ROTATOR CUFF REPAIR       TONSILLECTOMY       TONSILLECTOMY AND ADENOIDECTOMY         reports that she has never smoked. She has never used smokeless tobacco. She reports current  alcohol use. She reports that she does not currently use drugs after having used the following drugs: Marijuana. family history includes Diabetes in her paternal grandmother; Diabetes type II in her father and son; Hypertension in her daughter and paternal grandmother.      Allergies  Allergen Reactions   Statins Swelling   Furosemide Swelling   Rosuvastatin Swelling and Other (See Comments)   Hydrocodone-Acetaminophen Other (See Comments)   Aspirin        Patient says it hurts her stomach, has to be coaed   Vicodin [Hydrocodone-Acetaminophen]        Patient says Vicodin makes her itch            Outpatient Encounter Medications as of 05/13/2022  Medication Sig   allopurinol (ZYLOPRIM) 300 MG tablet Take 300 mg by mouth as needed  (arthritis).   amLODipine (NORVASC) 2.5 MG tablet Take 1 tablet by mouth daily.   aspirin EC 81 MG tablet Take 1 tablet by mouth daily.   carvedilol (COREG) 3.125 MG tablet Take 3.125 mg by mouth 2 (two) times daily with a meal.   Cholecalciferol (VITAMIN D3) 1.25 MG (50000 UT) CAPS Take 1 capsule by mouth once a week.   colchicine 0.6 MG tablet Take 0.6 mg by mouth as needed (Gout).   dapagliflozin propanediol (FARXIGA) 10 MG TABS tablet Take 10 mg by mouth daily.    insulin degludec (TRESIBA) 200 UNIT/ML FlexTouch Pen Inject 45 Units into the skin daily.   levothyroxine (SYNTHROID, LEVOTHROID) 25 MCG tablet Take 50 mcg by mouth every other day.   montelukast (SINGULAIR) 10 MG tablet Take 10 mg by mouth at bedtime.   nitroGLYCERIN (NITROSTAT) 0.4 MG SL tablet Place 1 tablet under the tongue as needed for chest pain.   nystatin cream (MYCOSTATIN) Apply 1 application  topically as needed for dry skin.   OZEMPIC, 1 MG/DOSE, 4 MG/3ML SOPN Inject 1 mg into the skin once a week.   permethrin (ELIMITE) 5 % cream Apply 1 application topically once a week.   predniSONE (DELTASONE) 5 MG tablet Take 5 mg by mouth as needed (Arthritis).   REPATHA SURECLICK [id number] MG/ML SOAJ Inject 140 mg as directed every 14 (fourteen) days.   valsartan (DIOVAN) 160 MG tablet Take 160 mg by mouth daily.   torsemide (DEMADEX) 5 MG tablet Take 1 tablet (5 mg total) by mouth daily.    No facility-administered encounter medications on file as of 05/13/2022.        REVIEW OF SYSTEMS  : All other systems reviewed and negative except where noted in the History of Present Illness.     PHYSICAL EXAM: BP (!) 152/92   Pulse 82   Ht '5\' 6"'$  (1.676 m)   Wt 248 lb (112.5 kg)   BMI 40.03 kg/m  General: Well developed female in no acute distress Head: Normocephalic and atraumatic Eyes  Sclerae anicteric, conjunctiva pink. Ears: Normal auditory acuity Lungs: Clear throughout to auscultation; no W/R/R. Heart: Regular rate and  rhythm; no M/R/G. Abdomen: Soft, multiple scars noted causing asymmetry of the abdomen, diastasis recti also noted.  Upper abdominal TTP. Rectal:  Will be done at the time of colonoscopy. Musculoskeletal: Symmetrical with no gross deformities  Skin: No lesions on visible extremities Extremities: No edema  Neurological: Alert oriented x 4, grossly non-focal Psychological:  Alert and cooperative. Normal mood and affect   ASSESSMENT AND PLAN: *GERD and epigastric abdominal pain:  Will plan for EGD with Dr. Lyndel Safe.   *Chronic  constipation:  Multifactorial.  Has tried Miralax and Trulance before.  Will try Linzess 290 mcg daily.  Samples were given to start. *History of colonic polyps (colon 04/2012, colonic polyp status post polypectomy, mild sigmoid diverticulosis, internal hemorrhoids, otherwise normal to TI).  Will plan for colonoscopy with 2 day bowel prep.  *IDDM:   Insulin will be adjusted prior to endoscopic procedure per protocol. Will resume normal dosing after procedure.    Dr. Lyndel Safe had wanted to do EGD and colonoscopy in early 2020 after he saw her last.     CC:  Nicholos Johns, MD

## 2022-07-08 ENCOUNTER — Telehealth: Payer: Self-pay

## 2022-07-08 NOTE — Telephone Encounter (Signed)
  Follow up Call-     07/07/2022    2:33 PM  Call back number  Post procedure Call Back phone  # [phone]  Permission to leave phone message Yes     Patient questions:  Do you have a fever, pain , or abdominal swelling? No. Pain Score  0 *  Have you tolerated food without any problems? Yes.    Have you been able to return to your normal activities? Yes.    Do you have any questions about your discharge instructions: Diet   No. Medications  No. Follow up visit  No.  Do you have questions or concerns about your Care? No.  Actions: * If pain score is 4 or above: No action needed, pain <4.

## 2022-07-12 LAB — SURGICAL PATHOLOGY

## 2022-07-17 ENCOUNTER — Encounter: Payer: Self-pay | Admitting: Gastroenterology

## 2022-07-20 ENCOUNTER — Telehealth: Payer: Self-pay | Admitting: Gastroenterology

## 2022-07-20 NOTE — Telephone Encounter (Signed)
Patient called to follow up with Pathology reports.

## 2022-07-21 NOTE — Telephone Encounter (Signed)
Pt made aware of recenty pathology results from the Letter created by Dr. Lyndel Safe. Letter sent to pt via mail. Pt made aware. Pt verbalized understanding with all questions answered.

## 2022-07-22 ENCOUNTER — Encounter: Payer: Self-pay | Admitting: Emergency Medicine

## 2022-10-21 ENCOUNTER — Ambulatory Visit: Payer: Medicare HMO | Admitting: Cardiology

## 2023-01-17 ENCOUNTER — Encounter: Payer: Self-pay | Admitting: Cardiology

## 2023-01-17 ENCOUNTER — Ambulatory Visit: Payer: 59 | Attending: Cardiology | Admitting: Cardiology

## 2023-01-17 VITALS — BP 154/98 | HR 72 | Ht 64.6 in | Wt 249.0 lb

## 2023-01-17 DIAGNOSIS — I351 Nonrheumatic aortic (valve) insufficiency: Secondary | ICD-10-CM

## 2023-01-17 DIAGNOSIS — E1142 Type 2 diabetes mellitus with diabetic polyneuropathy: Secondary | ICD-10-CM

## 2023-01-17 DIAGNOSIS — E785 Hyperlipidemia, unspecified: Secondary | ICD-10-CM

## 2023-01-17 DIAGNOSIS — I10 Essential (primary) hypertension: Secondary | ICD-10-CM

## 2023-01-17 DIAGNOSIS — I2510 Atherosclerotic heart disease of native coronary artery without angina pectoris: Secondary | ICD-10-CM

## 2023-01-17 DIAGNOSIS — Z794 Long term (current) use of insulin: Secondary | ICD-10-CM

## 2023-01-17 MED ORDER — TORSEMIDE 5 MG PO TABS: 5.0000 mg | ORAL_TABLET | Freq: Every day | ORAL | 3 refills | Status: DC

## 2023-01-17 MED ORDER — REPATHA SURECLICK 140 MG/ML ~~LOC~~ SOAJ: 140.0000 mg | SUBCUTANEOUS | 9 refills | Status: DC

## 2023-01-17 NOTE — Patient Instructions (Signed)
Medication Instructions:  Your physician recommends that you continue on your current medications as directed. Please refer to the Current Medication list given to you today.  *If you need a refill on your cardiac medications before your next appointment, please call your pharmacy*   Lab Work: Your physician recommends that you return for lab work in:   Labs today: BMP  If you have labs (blood work) drawn today and your tests are completely normal, you will receive your results only by: MyChart Message (if you have MyChart) OR A paper copy in the mail If you have any lab test that is abnormal or we need to change your treatment, we will call you to review the results.   Testing/Procedures: Your physician has requested that you have an echocardiogram. Echocardiography is a painless test that uses sound waves to create images of your heart. It provides your doctor with information about the size and shape of your heart and how well your heart's chambers and valves are working. This procedure takes approximately one hour. There are no restrictions for this procedure. Please do NOT wear cologne, perfume, aftershave, or lotions (deodorant is allowed). Please arrive 15 minutes prior to your appointment time.  Your physician has requested that you have a carotid duplex. This test is an ultrasound of the carotid arteries in your neck. It looks at blood flow through these arteries that supply the brain with blood. Allow one hour for this exam. There are no restrictions or special instructions.    Follow-Up: At [facility], you and your health needs are our priority.  As part of our continuing mission to provide you with exceptional heart care, we have created designated Provider Care Teams.  These Care Teams include your primary Cardiologist (physician) and Advanced Practice Providers (APPs -  Physician Assistants and Nurse Practitioners) who all work together to provide you with the care  you need, when you need it.  We recommend signing up for the patient portal called "MyChart".  Sign up information is provided on this After Visit Summary.  MyChart is used to connect with patients for Virtual Visits (Telemedicine).  Patients are able to view lab/test results, encounter notes, upcoming appointments, etc.  Non-urgent messages can be sent to your provider as well.   To learn more about what you can do with MyChart, go to [email or web address].    Your next appointment:   6 month(s)  Provider:   Gypsy Balsam, MD    Other Instructions None

## 2023-01-17 NOTE — Progress Notes (Signed)
Cardiology Office Note:    Date:  01/17/2023   ID:  Michelle Harding, DOB [birth year], MRN [id number]  PCP:  Melvenia Beam, MD  Cardiologist:  Gypsy Balsam, MD    Referring MD: Lucianne Lei, MD   No chief complaint on file. Doing well  History of Present Illness:    Michelle Harding is a 72 y.o. female past medical history significant for coronary artery disease cardiac catheterization 2013 showed only luminal disease.  She did have a stress test done in 2020 which showed no evidence of ischemia.  Multiple risk factors for coronary artery disease namely hypertension, dyslipidemia, diabetes.  She had back surgery done a while ago apparently had been unsatisfied results of it.  Comes today to my office after not being seen for a while, she was in the hospital recently with some neurological symptoms however nothing being identified.  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  Denies have any chest pain tightness squeezing pressure burning chest.  Walks around with a cane.  However she said she is planning to go and join the gym  Past Medical History:  Diagnosis Date   Abdominal pain, epigastric 05/13/2022   Aortic regurgitation 08/06/2015   Arthritis of carpometacarpal [facility]) joint of right thumb 05/26/2017   Asthma    Bradycardia by electrocardiogram 02/10/2012   Carpal tunnel syndrome of right wrist 08/16/2017   Chronic constipation    Chronic pain syndrome 08/06/2015   Common migraine 08/06/2015   COPD (chronic obstructive pulmonary disease) (HCC)    Coronary artery disease involving native coronary artery of native heart without angina pectoris 04/24/2015   Luminal disease by cardiac catheterization from 2013   Cubital tunnel syndrome on right 08/16/2017   Diabetes mellitus (HCC) 02/07/2012   Diabetes, polyneuropathy (HCC) 08/06/2015   Diplopia 08/06/2015   Dyslipidemia 04/24/2015   Gastroesophageal reflux disease without  esophagitis 05/13/2022   Gout attack 02/07/2012   Heart disease    Hemorrhoids    Herniated nucleus pulposus, L5-S1, right 08/06/2015   Formatting of this note might be different from the original. Right L5 and S1 roots.  L4-5, possible left L4 root   High risk medication use 08/06/2015   History of colonic polyps 05/13/2022   Hyperlipidemia 08/06/2015   Hypertension    IBS (irritable bowel syndrome)    Insulin dependent type 2 diabetes mellitus (HCC) 08/06/2015   Knee pain 08/06/2015   Low back pain 08/06/2015   Lumbar radiculopathy 08/06/2015   Formatting of this note might be different from the original. Right   Meningioma (HCC)    Morbid obesity (HCC) 03/31/2020   Osteoarthritis    Osteoarthritis    Ovarian cyst    Preop cardiovascular exam 07/01/2020   Tear of left infraspinatus tendon 08/29/2017   Tear of left supraspinatus tendon 08/29/2017   Tenosynovitis of left wrist 08/29/2017   Trigeminal neuralgia of left side of face 07/11/2014   Type 2 diabetes mellitus with diabetic polyneuropathy, with long-term current use of insulin (HCC) 08/06/2015   Wrist pain, acute, right 05/26/2017    Past Surgical History:  Procedure Laterality Date   CATARACT EXTRACTION Left 03/2013   CATARACT EXTRACTION Right 05/2013   CHOLECYSTECTOMY  1983   COLONOSCOPY  04/27/2012   Colonic polyp status post polypectomy. Minimal sigmoid diverticulosis. Small internal hemorrhoids.    ESOPHAGOGASTRODUODENOSCOPY  06/24/2010   Small hiatal hernia   HAND SURGERY     HERNIA REPAIR  With mesh   KNEE SURGERY  2012   OOPHORECTOMY Left 07/10/2015   OOPHORECTOMY Right 1989   ROTATOR CUFF REPAIR     TONSILLECTOMY     TONSILLECTOMY AND ADENOIDECTOMY      Current Medications: Current Meds  Medication Sig   allopurinol (ZYLOPRIM) 300 MG tablet Take 300 mg by mouth as needed (arthritis).   amLODipine (NORVASC) 10 MG tablet Take 10 mg by mouth daily.   aspirin EC 81 MG tablet Take 1 tablet by  mouth daily.   carvedilol (COREG) 3.125 MG tablet Take 3.125 mg by mouth 2 (two) times daily with a meal.   Cholecalciferol (VITAMIN D3) 1.25 MG (50000 UT) CAPS Take 1 capsule by mouth once a week.   colchicine 0.6 MG tablet Take 0.6 mg by mouth as needed (Gout).   dapagliflozin propanediol (FARXIGA) 10 MG TABS tablet Take 10 mg by mouth daily.    levothyroxine (SYNTHROID, LEVOTHROID) 25 MCG tablet Take 50 mcg by mouth every other day.   montelukast (SINGULAIR) 10 MG tablet Take 10 mg by mouth at bedtime.   nitroGLYCERIN (NITROSTAT) 0.4 MG SL tablet Place 1 tablet under the tongue as needed for chest pain.   nystatin cream (MYCOSTATIN) Apply 1 application  topically as needed for dry skin.   OZEMPIC, 1 MG/DOSE, 4 MG/3ML SOPN Inject 1 mg into the skin once a week.   pantoprazole (PROTONIX) 40 MG tablet Take 1 tablet (40 mg total) by mouth daily.   permethrin (ELIMITE) 5 % cream Apply 1 application topically once a week.   predniSONE (DELTASONE) 5 MG tablet Take 5 mg by mouth as needed (Arthritis).   REPATHA SURECLICK 140 MG/ML SOAJ Inject 140 mg as directed every 14 (fourteen) days.   traMADol (ULTRAM) 50 MG tablet Take 1 tablet (50 mg total) by mouth every 6 (six) hours as needed.   TRESIBA FLEXTOUCH 100 UNIT/ML FlexTouch Pen Inject 45 Units into the skin at bedtime.   valsartan (DIOVAN) 160 MG tablet Take 160 mg by mouth daily.   [DISCONTINUED] amLODipine (NORVASC) 2.5 MG tablet Take 1 tablet by mouth daily.     Allergies:   Statins, Furosemide, Rosuvastatin, Hydrocodone-acetaminophen, Aspirin, and Vicodin [hydrocodone-acetaminophen]   Social History   Socioeconomic History   Marital status: Divorced    Spouse name: Not on file   Number of children: 7   Years of education: Not on file   Highest education level: Not on file  Occupational History   Occupation: Retired   Tobacco Use   Smoking status: Never   Smokeless tobacco: Never   Tobacco comments:    did smoke from time to time  but stopped 2018  Vaping Use   Vaping status: Never Used  Substance and Sexual Activity   Alcohol use: Yes    Comment: ocassionally   Drug use: Not Currently    Types: Marijuana    Comment: use was in the past    Sexual activity: Never  Other Topics Concern   Not on file  Social History Narrative   Not on file   Social Determinants of Health   Financial Resource Strain: Not at Risk (09/09/2022)   Received from [employer], [employer]    Financial Resource Strain: 1  Food Insecurity: Not at Risk (09/09/2022)   Received from [place], [employer] Insecurity    Food: 1  Transportation Needs: At Risk (09/09/2022)   Received from [place], [employer] Needs  Transportation: 2  Physical Activity: At Risk (09/09/2022)   Received from [place], [place]   Physical Activity    Physical Activity: 2  Stress: At Risk (09/09/2022)   Received from [place], [place]   Stress    Stress: 2  Social Connections: Not at Risk (09/09/2022)   Received from [facility]   Social Connections    Connectedness: 1     Family History: The patient's family history includes Diabetes in her paternal grandmother; Diabetes type II in her father and son; Hypertension in her daughter and paternal grandmother. There is no history of Colon cancer, Esophageal cancer, or Breast cancer. ROS:   Please see the history of present illness.    All 14 point review of systems negative except as described per history of present illness  EKGs/Labs/Other Studies Reviewed:         Recent Labs: 06/15/2022: ALT 15; BUN 11; Creatinine, Ser 0.87; Hemoglobin 12.8; Platelets 327; Potassium 3.7; Sodium 140  Recent Lipid Panel No results found for: "CHOL", "TRIG", "HDL", "CHOLHDL", "VLDL", "LDLCALC", "LDLDIRECT"  Physical Exam:    VS:  BP (!) 154/98   Pulse 72   Ht 5' 4.6" (1.641 m)   Wt 249 lb (112.9 kg)   SpO2 95%   BMI 41.95 kg/m     Wt Readings from Last 3 Encounters:  01/17/23 249 lb (112.9 kg)  07/07/22  248 lb (112.5 kg)  06/15/22 248 lb 0.3 oz (112.5 kg)     GEN:  Well nourished, well developed in no acute distress HEENT: Normal NECK: No JVD; No carotid bruits LYMPHATICS: No lymphadenopathy CARDIAC: RRR, no murmurs, no rubs, no gallops RESPIRATORY:  Clear to auscultation without rales, wheezing or rhonchi  ABDOMEN: Soft, non-tender, non-distended MUSCULOSKELETAL:  No edema; No deformity  SKIN: Warm and dry LOWER EXTREMITIES: no swelling NEUROLOGIC:  Alert and oriented x 3 PSYCHIATRIC:  Normal affect   ASSESSMENT:    1. Coronary artery disease involving native coronary artery of native heart without angina pectoris   2. Nonrheumatic aortic valve insufficiency   3. Type 2 diabetes mellitus with diabetic polyneuropathy, with long-term current use of insulin (HCC)   4. Morbid obesity (HCC)   5. Dyslipidemia   6. Primary hypertension    PLAN:    In order of problems listed above:  Coronary disease stable asymptomatic stress test done in 2020 was negative.  Continue risk factors modifications. Nonrheumatic aortic valve insufficiency was scheduled to have echocardiogram. Type 2 diabetes followed by internal medicine team last hemoglobin A1c I have from last year which is 7.3 slightly on the higher side, will try to get more up-to-date information's. Dyslipidemia I did review K PN LDL 36 initially 54 continue present management. Essential hypertension not well-controlled will check Chem-7 if Chem-7 is fine we will increase dose of Diovan. Carotic arterial stenosis.  Will schedule her to have carotic ultrasound.   Medication Adjustments/Labs and Tests Ordered: Current medicines are reviewed at length with the patient today.  Concerns regarding medicines are outlined above.  No orders of the defined types were placed in this encounter.  Medication changes: No orders of the defined types were placed in this encounter.   Signed, Georgeanna Lea, MD, [facility] 01/17/2023 11:58 AM     [facility]

## 2023-01-17 NOTE — Addendum Note (Signed)
Addended by: Roxanne Mins I on: 01/17/2023 12:22 PM   Modules accepted: Orders

## 2023-01-18 LAB — BASIC METABOLIC PANEL
BUN/Creatinine Ratio: 22 (ref 12–28)
BUN: 22 mg/dL (ref 8–27)
CO2: 27 mmol/L (ref 20–29)
Calcium: 10.1 mg/dL (ref 8.7–10.3)
Chloride: 101 mmol/L (ref 96–106)
Creatinine, Ser: 0.99 mg/dL (ref 0.57–1.00)
Glucose: 132 mg/dL — ABNORMAL HIGH (ref 70–99)
Potassium: 4 mmol/L (ref 3.5–5.2)
Sodium: 141 mmol/L (ref 134–144)
eGFR: 61 mL/min/{1.73_m2} (ref >59)

## 2023-01-20 ENCOUNTER — Other Ambulatory Visit (HOSPITAL_COMMUNITY): Payer: Self-pay

## 2023-01-20 ENCOUNTER — Telehealth: Payer: Self-pay | Admitting: Pharmacy Technician

## 2023-01-20 ENCOUNTER — Telehealth: Payer: Self-pay | Admitting: Cardiology

## 2023-01-20 NOTE — Telephone Encounter (Signed)
Pharmacy Patient Advocate Encounter  Received notification from [facility] that Prior Authorization for repatha has been APPROVED from 01/19/23 to 07/20/23. Ran test claim, Copay is $0.00. This test claim was processed through [facility]- copay amounts may vary at other pharmacies due to pharmacy/plan contracts, or as the patient moves through the different stages of their insurance plan.   PA #/Case ID/Reference #: [id number]

## 2023-01-20 NOTE — Telephone Encounter (Signed)
Pharmacy Patient Advocate Encounter   Received notification from Pt Calls Messages that prior authorization for repatha is required/requested.   Insurance verification completed.   The patient is insured through [facility] .   Per test claim: PA required; PA submitted to [facility] via CoverMyMeds Key/confirmation #/EOC [facility] Status is pending

## 2023-01-20 NOTE — Telephone Encounter (Signed)
Follow Up:     Checking to see if you received the prior authorization for the patient's Repatha Click>

## 2023-01-20 NOTE — Telephone Encounter (Signed)
No request for prior auth, but will initiate prior auth for Repatha

## 2023-01-20 NOTE — Telephone Encounter (Signed)
Pharmacy Patient Advocate Encounter   Received notification from Pt Calls Messages that prior authorization for repatha is required/requested.   Insurance verification completed.   The patient is insured through [facility] .   Per test claim: PA required; PA started via CoverMyMeds. KEY [id number] . Waiting for clinical questions to populate.

## 2023-01-20 NOTE — Telephone Encounter (Signed)
Called patient and informed her that her insurance had approved her prior authorization for her Repatha medication and she should be able to pick up the medication from her pharmacy. Patient also mentioned that she had not received a call to schedule an appointment for her to receive the Leqvio medication. I explained that she should give them a couple more days and if she does not receive a call to schedule an appointment then she should call them using the phone number listed on the ambulatory referral to the advanced lipid disorders clinic. Patient verbalized understanding and had no further questions at this time.

## 2023-02-17 ENCOUNTER — Ambulatory Visit: Payer: 59 | Attending: Cardiology

## 2023-02-17 ENCOUNTER — Ambulatory Visit: Payer: 59

## 2023-02-17 DIAGNOSIS — E1142 Type 2 diabetes mellitus with diabetic polyneuropathy: Secondary | ICD-10-CM

## 2023-02-17 DIAGNOSIS — I6522 Occlusion and stenosis of left carotid artery: Secondary | ICD-10-CM

## 2023-02-17 DIAGNOSIS — I351 Nonrheumatic aortic (valve) insufficiency: Secondary | ICD-10-CM

## 2023-02-17 DIAGNOSIS — I10 Essential (primary) hypertension: Secondary | ICD-10-CM

## 2023-02-17 DIAGNOSIS — I2510 Atherosclerotic heart disease of native coronary artery without angina pectoris: Secondary | ICD-10-CM | POA: Diagnosis not present

## 2023-02-17 DIAGNOSIS — Z794 Long term (current) use of insulin: Secondary | ICD-10-CM

## 2023-02-17 DIAGNOSIS — E785 Hyperlipidemia, unspecified: Secondary | ICD-10-CM

## 2023-02-19 LAB — ECHOCARDIOGRAM COMPLETE
AR max vel: 2.16 cm2
AV Area VTI: 2.07 cm2
AV Area mean vel: 2.09 cm2
AV Mean grad: 6.3 mm[Hg]
AV Peak grad: 11.6 mm[Hg]
Ao pk vel: 1.7 m/s
Area-P 1/2: 3.56 cm2
P 1/2 time: 553 ms
S' Lateral: 3.2 cm

## 2023-02-23 ENCOUNTER — Telehealth: Payer: Self-pay

## 2023-02-23 NOTE — Telephone Encounter (Signed)
-----   Message from Gypsy Balsam sent at 02/23/2023 11:08 AM EDT ----- Carotic artery up to 39% stenosis on the left side.  Medical therapy

## 2023-02-23 NOTE — Telephone Encounter (Signed)
Patient notified of results and verbalized understanding.  

## 2023-02-28 ENCOUNTER — Telehealth: Payer: Self-pay

## 2023-02-28 NOTE — Telephone Encounter (Signed)
Echo Results reviewed with pt as per Dr. Krasowski's note.  Pt verbalized understanding and had no additional questions. Routed to PCP 

## 2023-03-08 ENCOUNTER — Ambulatory Visit: Payer: 59

## 2023-03-08 ENCOUNTER — Telehealth: Payer: Self-pay | Admitting: Cardiology

## 2023-03-08 MED ORDER — REPATHA SURECLICK 140 MG/ML ~~LOC~~ SOAJ: 1.0000 mL | SUBCUTANEOUS | 3 refills | Status: AC

## 2023-03-08 NOTE — Telephone Encounter (Signed)
I talked to this patient on the phone she had an appointment today scheduled lipid clinic discussed Leqvio.  Patient on Repatha but having a hard time remembering to take.  We discussed taking on the first and the 15th of the month if this was more helpful as she is getting confused about every 2 weeks versus every week medications . we also talked about sending alert on her phone that would remind her to take this.  Suggested she have a family member help her with this and said it does repeating therefore it only needs to be sent once.  We talked about how Repatha has the cardiovascular risk reduction data and  Leqvio has yet to establish this.  Also generally see a slightly better LDL-C reduction with Repatha.  Her insurance only covers Leqvio if she feels Repatha.  With an LDL-C in the 30s she most certainly did not fail Repatha.  I think this will be hard for [place] to get covered.  I also offered to send in a 90-day supply which should help with compliance.  Appears patient opted to cancel her appointment today after our conversation.

## 2023-03-08 NOTE — Progress Notes (Deleted)
Patient ID: DESARAY SEPER                 DOB: 1950/09/24                    MRN: [id number]      HPI: Michelle Harding is a 72 y.o. female patient referred to lipid clinic by Dr. Bing Matter. PMH is significant for coronary artery disease cardiac catheterization 2013 showed only luminal disease, hypertension, dyslipidemia, diabetes. 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   Patient on Repatha however having a hard time remembering to take as she also takes Ozempic once a week.  She was interested in [place].  Has [place] healthcare dual complete which does require a PA and failure of Repatha. LDL-C 36 on Repatha (09/15/21). She reports missing doses and ran out recently.  Reminder in phone Home calendar Reminder with drug company 1st 15th    Reviewed options for lowering LDL cholesterol, including ezetimibe, PCSK-9 inhibitors, bempedoic acid and inclisiran.  Discussed mechanisms of action, dosing, side effects and potential decreases in LDL cholesterol.  Also reviewed cost information and potential options for patient assistance.   Current Medications:  Intolerances:  Risk Factors:  LDL-C goal:  ApoB goal:   Diet:   Exercise:   Family History:   Social History:   Labs: Lipid Panel  No results found for: "CHOL", "TRIG", "HDL", "CHOLHDL", "VLDL", "LDLCALC", "LDLDIRECT", "LABVLDL"  Past Medical History:  Diagnosis Date   Abdominal pain, epigastric 05/13/2022   Aortic regurgitation 08/06/2015   Arthritis of carpometacarpal (CMC) joint of right thumb 05/26/2017   Asthma    Bradycardia by electrocardiogram 02/10/2012   Carpal tunnel syndrome of right wrist 08/16/2017   Chronic constipation    Chronic pain syndrome 08/06/2015   Common migraine 08/06/2015   COPD (chronic obstructive pulmonary disease) (HCC)    Coronary artery disease involving native coronary artery of native heart without angina pectoris 04/24/2015   Luminal  disease by cardiac catheterization from 2013   Cubital tunnel syndrome on right 08/16/2017   Diabetes mellitus (HCC) 02/07/2012   Diabetes, polyneuropathy (HCC) 08/06/2015   Diplopia 08/06/2015   Dyslipidemia 04/24/2015   Gastroesophageal reflux disease without esophagitis 05/13/2022   Gout attack 02/07/2012   Heart disease    Hemorrhoids    Herniated nucleus pulposus, L5-S1, right 08/06/2015   Formatting of this note might be different from the original. Right L5 and S1 roots.  L4-5, possible left L4 root   High risk medication use 08/06/2015   History of colonic polyps 05/13/2022   Hyperlipidemia 08/06/2015   Hypertension    IBS (irritable bowel syndrome)    Insulin dependent type 2 diabetes mellitus (HCC) 08/06/2015   Knee pain 08/06/2015   Low back pain 08/06/2015   Lumbar radiculopathy 08/06/2015   Formatting of this note might be different from the original. Right   Meningioma (HCC)    Morbid obesity (HCC) 03/31/2020   Osteoarthritis    Osteoarthritis    Ovarian cyst    Preop cardiovascular exam 07/01/2020   Tear of left infraspinatus tendon 08/29/2017   Tear of left supraspinatus tendon 08/29/2017   Tenosynovitis of left wrist 08/29/2017   Trigeminal neuralgia of left side of face 07/11/2014   Type 2 diabetes mellitus with diabetic polyneuropathy, with long-term current use of insulin (HCC) 08/06/2015   Wrist pain, acute, right 05/26/2017    Current Outpatient Medications on File Prior to Visit  Medication Sig Dispense Refill   allopurinol (ZYLOPRIM) 300 MG tablet Take 300 mg by mouth as needed (arthritis).     amLODipine (NORVASC) 10 MG tablet Take 10 mg by mouth daily.     aspirin EC 81 MG tablet Take 1 tablet by mouth daily.     carvedilol (COREG) 3.125 MG tablet Take 3.125 mg by mouth 2 (two) times daily with a meal.     Cholecalciferol (VITAMIN D3) 1.25 MG (50000 UT) CAPS Take 1 capsule by mouth once a week.     colchicine 0.6 MG tablet Take 0.6 mg by mouth as  needed (Gout).     dapagliflozin propanediol (FARXIGA) 10 MG TABS tablet Take 10 mg by mouth daily.      levothyroxine (SYNTHROID, LEVOTHROID) 25 MCG tablet Take 50 mcg by mouth every other day.     montelukast (SINGULAIR) 10 MG tablet Take 10 mg by mouth at bedtime.     nitroGLYCERIN (NITROSTAT) 0.4 MG SL tablet Place 1 tablet under the tongue as needed for chest pain.     nystatin cream (MYCOSTATIN) Apply 1 application  topically as needed for dry skin.     OZEMPIC, 1 MG/DOSE, 4 MG/3ML SOPN Inject 1 mg into the skin once a week.     pantoprazole (PROTONIX) 40 MG tablet Take 1 tablet (40 mg total) by mouth daily. 30 tablet 11   permethrin (ELIMITE) 5 % cream Apply 1 application topically once a week.     predniSONE (DELTASONE) 5 MG tablet Take 5 mg by mouth as needed (Arthritis).     REPATHA SURECLICK 140 MG/ML SOAJ Inject 140 mg as directed every 14 (fourteen) days. 2 mL 9   torsemide (DEMADEX) 5 MG tablet Take 1 tablet (5 mg total) by mouth daily. 90 tablet 3   traMADol (ULTRAM) 50 MG tablet Take 1 tablet (50 mg total) by mouth every 6 (six) hours as needed. 15 tablet 0   TRESIBA FLEXTOUCH 100 UNIT/ML FlexTouch Pen Inject 45 Units into the skin at bedtime.     valsartan (DIOVAN) 160 MG tablet Take 160 mg by mouth daily.     No current facility-administered medications on file prior to visit.    Allergies  Allergen Reactions   Statins Swelling   Furosemide Swelling   Rosuvastatin Swelling and Other (See Comments)   Hydrocodone-Acetaminophen Other (See Comments)   Aspirin     Patient says it hurts her stomach, has to be coaed   Vicodin [Hydrocodone-Acetaminophen]     Patient says Vicodin makes her itch    Assessment/Plan:  1. Hyperlipidemia -  No problem-specific Assessment & Plan notes found for this encounter.    Thank you,  Olene Floss, Pharm.D, BCPS, CPP [facility] of [facility] [address], [place], [place] [address]   Phone: [phone]; Fax: [phone]

## 2023-03-08 NOTE — Telephone Encounter (Signed)
*  STAT* If patient is at the pharmacy, call can be transferred to refill team.   1. Which medications need to be refilled? (please list name of each medication and dose if known)  REPATHA SURECLICK 140 MG/ML SOAJ  2. Which pharmacy/location (including street and city if local pharmacy) is medication to be sent to? [id number] - [place], [place] - [address]  3. Do they need a 30 day or 90 day supply?  90 day supply

## 2023-06-30 ENCOUNTER — Encounter: Payer: Self-pay | Admitting: Gastroenterology

## 2023-07-10 ENCOUNTER — Encounter: Payer: Self-pay | Admitting: Cardiology

## 2023-07-10 NOTE — Progress Notes (Deleted)
 Cardiology Office Note:  .   Date:  07/10/2023  ID:  Michelle Harding, DOB 09-Nov-1950, MRN [id number] PCP: Melvenia Beam, MD  [facility] Providers Cardiologist:  Gypsy Balsam, MD { Click to update primary MD,subspecialty MD or APP then REFRESH:1}   History of Present Illness: .   Michelle Harding is a 74 y.o. female with a past medical history of nonobstructive CAD per left heart cath in 2013, carotid artery stenosis, aortic regurgitation, hypertension, COPD, GERD, IBS, DM2, chronic pain syndrome, dyslipidemia, aortic regurgitation.  02/17/2023 carotid duplex mild carotid artery stenosis 02/17/2023 echo EF 60-65%, moderate LVH, grade I DD, aortic regurgitation with sclerosis 02/21/2019 lexiscan low risk, normal study 01/04/2019 echo EF 60-65%, severe concentric LVH, mild aortic valve regurgitation 12/17/2018 monitor average HR 79, 1 episode of VT 2013 left heart cath luminal disease  Most recently she was evaluated by Dr. Bing Matter on 01/17/2023, she was stable from a cardiac perspective, repeat echocardiogram and carotid duplex were arranged and she was advised to follow-up  ROS: ROS   Studies Reviewed: .        Cardiac Studies & Procedures   ______________________________________________________________________________________________   STRESS TESTS  MYOCARDIAL PERFUSION IMAGING 02/22/2019  Narrative  The left ventricular ejection fraction is normal (55-65%).  Nuclear stress EF: 57%.  There was no ST segment deviation noted during stress.  The study is normal.  This is a low risk study.   ECHOCARDIOGRAM  ECHOCARDIOGRAM COMPLETE 02/17/2023  Narrative ECHOCARDIOGRAM REPORT    Patient Name:   Michelle Harding Date of Exam: 02/17/2023 Medical Rec #:  [id number]      Height:       64.6 in Accession #:    [id number]     Weight:       249.0 lb Date of Birth:  1950/10/17     BSA:          2.162 m Patient Age:    71 years       BP:           154/98  mmHg Patient Gender: F              HR:           78 bpm. Exam Location:  [place]  Procedure: 2D Echo, Cardiac Doppler, Color Doppler and Strain Analysis  Indications:    Coronary artery disease involving native coronary artery of native heart without angina pectoris [I25.10 (ICD-10-CM)]; Nonrheumatic aortic valve insufficiency [I35.1 (ICD-10-CM)]; Type 2 diabetes mellitus with diabetic polyneuropathy, with long-term current use of insulin (HCC) [E11.42, Z79.4 (ICD-10-CM)]; Morbid obesity (HCC) [E66.01 (ICD-10-CM)]; Dyslipidemia [E78.5 (ICD-10-CM)]; Primary hypertension [I10 (ICD-10-CM)]  History:        Patient has prior history of Echocardiogram examinations, most recent 01/04/2019. CAD; Risk Factors:Diabetes, Dyslipidemia and Hypertension.  Sonographer:    Margreta Journey RDCS Referring Phys: [id number] Georgeanna Lea   Sonographer Comments: Image acquisition challenging due to patient body habitus. IMPRESSIONS   1. Left ventricular ejection fraction, by estimation, is 60 to 65%. The left ventricle has normal function. The left ventricle has no regional wall motion abnormalities. There is moderate left ventricular hypertrophy. Left ventricular diastolic parameters are consistent with Grade I diastolic dysfunction (impaired relaxation). 2. Right ventricular systolic function is normal. The right ventricular size is normal. 3. The mitral valve is normal in structure. No evidence of mitral valve regurgitation. No evidence of mitral stenosis. 4. The aortic valve is normal in structure. Aortic valve regurgitation is mild.  Aortic valve sclerosis is present, with no evidence of aortic valve stenosis. 5. The inferior vena cava is normal in size with greater than 50% respiratory variability, suggesting right atrial pressure of 3 mmHg.  FINDINGS Left Ventricle: Left ventricular ejection fraction, by estimation, is 60 to 65%. The left ventricle has normal function. The left ventricle has  no regional wall motion abnormalities. The left ventricular internal cavity size was normal in size. There is moderate left ventricular hypertrophy. Left ventricular diastolic parameters are consistent with Grade I diastolic dysfunction (impaired relaxation).  Right Ventricle: The right ventricular size is normal. No increase in right ventricular wall thickness. Right ventricular systolic function is normal.  Left Atrium: Left atrial size was normal in size.  Right Atrium: Right atrial size was normal in size.  Pericardium: There is no evidence of pericardial effusion.  Mitral Valve: The mitral valve is normal in structure. No evidence of mitral valve regurgitation. No evidence of mitral valve stenosis.  Tricuspid Valve: The tricuspid valve is normal in structure. Tricuspid valve regurgitation is not demonstrated. No evidence of tricuspid stenosis.  Aortic Valve: The aortic valve is normal in structure. Aortic valve regurgitation is mild. Aortic regurgitation PHT measures 553 msec. Aortic valve sclerosis is present, with no evidence of aortic valve stenosis. Aortic valve mean gradient measures 6.3 mmHg. Aortic valve peak gradient measures 11.6 mmHg. Aortic valve area, by VTI measures 2.07 cm.  Pulmonic Valve: The pulmonic valve was normal in structure. Pulmonic valve regurgitation is not visualized. No evidence of pulmonic stenosis.  Aorta: The aortic root is normal in size and structure.  Venous: The inferior vena cava is normal in size with greater than 50% respiratory variability, suggesting right atrial pressure of 3 mmHg.  IAS/Shunts: No atrial level shunt detected by color flow Doppler.   LEFT VENTRICLE PLAX 2D LVIDd:         4.50 cm   Diastology LVIDs:         3.20 cm   LV e' medial:    6.31 cm/s LV PW:         1.60 cm   LV E/e' medial:  15.3 LV IVS:        1.50 cm   LV e' lateral:   7.72 cm/s LVOT diam:     2.10 cm   LV E/e' lateral: 12.5 LV SV:         75 LV SV Index:    35 LVOT Area:     3.46 cm   RIGHT VENTRICLE             IVC RV Basal diam:  3.20 cm     IVC diam: 3.60 cm RV Mid diam:    2.50 cm RV S prime:     14.80 cm/s TAPSE (M-mode): 2.1 cm  LEFT ATRIUM             Index        RIGHT ATRIUM           Index LA diam:        3.40 cm 1.57 cm/m   RA Area:     14.80 cm LA Vol (A2C):   41.4 ml 19.15 ml/m  RA Volume:   35.90 ml  16.61 ml/m LA Vol (A4C):   87.4 ml 40.43 ml/m LA Biplane Vol: 60.8 ml 28.13 ml/m AORTIC VALVE AV Area (Vmax):    2.16 cm AV Area (Vmean):   2.09 cm AV Area (VTI):     2.07 cm  AV Vmax:           170.33 cm/s AV Vmean:          115.667 cm/s AV VTI:            0.363 m AV Peak Grad:      11.6 mmHg AV Mean Grad:      6.3 mmHg LVOT Vmax:         106.33 cm/s LVOT Vmean:        69.767 cm/s LVOT VTI:          0.217 m LVOT/AV VTI ratio: 0.60 AI PHT:            553 msec  AORTA Ao Root diam: 3.10 cm Ao Asc diam:  3.70 cm Ao Desc diam: 2.30 cm  MITRAL VALVE MV Area (PHT): 3.56 cm     SHUNTS MV Decel Time: 213 msec     Systemic VTI:  0.22 m MV E velocity: 96.25 cm/s   Systemic Diam: 2.10 cm MV A velocity: 109.50 cm/s MV E/A ratio:  0.88  Belva Crome MD Electronically signed by Belva Crome MD Signature Date/Time: 02/19/2023/8:37:35 AM    Final    MONITORS  LONG TERM MONITOR (3-14 DAYS) 12/15/2018  Narrative Michelle Harding, DOB 1951-03-27, MRN [id number]  HOLTER MONITOR REPORT:    Date of test:                 11/24/2018 Duration of test:           12 days Indication:                    Bradycardia Ordering physician:  Georgeanna Lea, MD Referring physician:  Georgeanna Lea, MD   Baseline rhythm: Sinus  Minimum heart rate: 53 BPM.  Average heart rate: 79 BPM.  Maximal heart rate 136 BPM.  Atrial arrhythmia: One episode of narrow complex tachycardia total 7 beats at rate of 118.  Ventricular arrhythmia: 1 premature ventricular beats noted  Conduction abnormality: None  Symptoms:  Multiple trigger events showing normal sinus rhythm   Conclusion: 1 episodes narrow complex tachycardia. No significant bradycardia noted  Interpreting  cardiologist: Gypsy Balsam, MD Date: 12/17/2018 9:56 AM       ______________________________________________________________________________________________      Risk Assessment/Calculations:   {Does this patient have ATRIAL FIBRILLATION?:[id number]} No BP recorded.  {Refresh Note OR Click here to enter BP  :1}***       Physical Exam:   VS:  There were no vitals taken for this visit.   Wt Readings from Last 3 Encounters:  01/17/23 249 lb (112.9 kg)  07/07/22 248 lb (112.5 kg)  06/15/22 248 lb 0.3 oz (112.5 kg)    GEN: Well nourished, well developed in no acute distress NECK: No JVD; No carotid bruits CARDIAC: ***RRR, no murmurs, rubs, gallops RESPIRATORY:  Clear to auscultation without rales, wheezing or rhonchi  ABDOMEN: Soft, non-tender, non-distended EXTREMITIES:  No edema; No deformity   ASSESSMENT AND PLAN: .   ***    {Are you ordering a CV Procedure (e.g. stress test, cath, DCCV, TEE, etc)?   Press F2        :[id number]}  Dispo: ***  Signed, Flossie Dibble, NP

## 2023-07-11 ENCOUNTER — Ambulatory Visit: Payer: 59 | Admitting: Cardiology

## 2023-07-11 DIAGNOSIS — I351 Nonrheumatic aortic (valve) insufficiency: Secondary | ICD-10-CM

## 2023-07-11 DIAGNOSIS — E1142 Type 2 diabetes mellitus with diabetic polyneuropathy: Secondary | ICD-10-CM

## 2023-07-11 DIAGNOSIS — I10 Essential (primary) hypertension: Secondary | ICD-10-CM

## 2023-07-11 DIAGNOSIS — I2510 Atherosclerotic heart disease of native coronary artery without angina pectoris: Secondary | ICD-10-CM

## 2023-07-11 DIAGNOSIS — E785 Hyperlipidemia, unspecified: Secondary | ICD-10-CM

## 2023-07-21 ENCOUNTER — Encounter: Payer: Self-pay | Admitting: Cardiology

## 2023-07-22 ENCOUNTER — Encounter: Payer: Self-pay | Admitting: Cardiology

## 2023-07-22 ENCOUNTER — Ambulatory Visit: Attending: Cardiology | Admitting: Cardiology

## 2023-07-22 VITALS — BP 142/80 | HR 82 | Ht 66.0 in | Wt 240.2 lb

## 2023-07-22 DIAGNOSIS — E1142 Type 2 diabetes mellitus with diabetic polyneuropathy: Secondary | ICD-10-CM | POA: Diagnosis not present

## 2023-07-22 DIAGNOSIS — I2510 Atherosclerotic heart disease of native coronary artery without angina pectoris: Secondary | ICD-10-CM | POA: Diagnosis not present

## 2023-07-22 DIAGNOSIS — E785 Hyperlipidemia, unspecified: Secondary | ICD-10-CM | POA: Diagnosis not present

## 2023-07-22 DIAGNOSIS — I10 Essential (primary) hypertension: Secondary | ICD-10-CM

## 2023-07-22 DIAGNOSIS — Z794 Long term (current) use of insulin: Secondary | ICD-10-CM

## 2023-07-22 NOTE — Patient Instructions (Signed)

## 2023-07-22 NOTE — Progress Notes (Signed)
 Cardiology Office Note:    Date:  07/22/2023   ID:  [name], DOB Oct 26, 1950, MRN [id number]  PCP:  Melvenia Beam, MD  Cardiologist:  Gypsy Balsam, MD    Referring MD: Melvenia Beam,*   Chief Complaint  Patient presents with   elevater HR    History of Present Illness:    Michelle Harding is a 73 y.o. female past medical history significant for coronary artery disease in 2013 she had cardiac catheterization which showed only luminal disease, stress test done in 2020 showed no evidence of ischemia, she does have known multiple risk factors for coronary artery disease namely hypertension, dyslipidemia, diabetes also she does have carotic arterial disease but only mild.  Comes today to months for follow-up overall seems to be doing fine.  But complain of being weak tired exhausted complain of having headaches complain of having some sinus problem she also complained that her blood pressure is very low however when I told her what was the number she tells me 140 systolic I told her that is not really low.  Past Medical History:  Diagnosis Date   Abdominal pain, epigastric 05/13/2022   Aortic regurgitation 08/06/2015   Arthritis of carpometacarpal [facility]) joint of right thumb 05/26/2017   Asthma    Bradycardia by electrocardiogram 02/10/2012   Carotid artery stenosis 2024   Carpal tunnel syndrome of right wrist 08/16/2017   Chronic constipation    Chronic pain syndrome 08/06/2015   Common migraine 08/06/2015   COPD (chronic obstructive pulmonary disease) (HCC)    Coronary artery disease involving native coronary artery of native heart without angina pectoris 04/24/2015   Luminal disease by cardiac catheterization from 2013   Cubital tunnel syndrome on right 08/16/2017   Diabetes mellitus (HCC) 02/07/2012   Diabetes, polyneuropathy (HCC) 08/06/2015   Diplopia 08/06/2015   Dyslipidemia 04/24/2015   Gastroesophageal reflux disease without esophagitis 05/13/2022    Gout attack 02/07/2012   Heart disease    Hemorrhoids    Herniated nucleus pulposus, L5-S1, right 08/06/2015   Formatting of this note might be different from the original. Right L5 and S1 roots.  L4-5, possible left L4 root   High risk medication use 08/06/2015   History of colonic polyps 05/13/2022   Hyperlipidemia 08/06/2015   Hypertension    IBS (irritable bowel syndrome)    Insulin dependent type 2 diabetes mellitus (HCC) 08/06/2015   Knee pain 08/06/2015   Low back pain 08/06/2015   Lumbar radiculopathy 08/06/2015   Formatting of this note might be different from the original. Right   Meningioma (HCC)    Morbid obesity (HCC) 03/31/2020   Osteoarthritis    Osteoarthritis    Ovarian cyst    Preop cardiovascular exam 07/01/2020   Tear of left infraspinatus tendon 08/29/2017   Tear of left supraspinatus tendon 08/29/2017   Tenosynovitis of left wrist 08/29/2017   Trigeminal neuralgia of left side of face 07/11/2014   Type 2 diabetes mellitus with diabetic polyneuropathy, with long-term current use of insulin (HCC) 08/06/2015   Wrist pain, acute, right 05/26/2017    Past Surgical History:  Procedure Laterality Date   CATARACT EXTRACTION Left 03/2013   CATARACT EXTRACTION Right 05/2013   CHOLECYSTECTOMY  1983   COLONOSCOPY  04/27/2012   Colonic polyp status post polypectomy. Minimal sigmoid diverticulosis. Small internal hemorrhoids.    ESOPHAGOGASTRODUODENOSCOPY  06/24/2010   Small hiatal hernia   HAND SURGERY     HERNIA REPAIR     With  mesh   KNEE SURGERY  2012   OOPHORECTOMY Left 07/10/2015   OOPHORECTOMY Right 1989   ROTATOR CUFF REPAIR     TONSILLECTOMY     TONSILLECTOMY AND ADENOIDECTOMY      Current Medications: Current Meds  Medication Sig   allopurinol (ZYLOPRIM) 300 MG tablet Take 300 mg by mouth as needed (arthritis).   amLODipine (NORVASC) 10 MG tablet Take 10 mg by mouth daily.   aspirin EC 81 MG tablet Take 1 tablet by mouth daily.   carvedilol  (COREG) 3.125 MG tablet Take 3.125 mg by mouth 2 (two) times daily with a meal.   Cholecalciferol (VITAMIN D3) 1.25 MG (50000 UT) CAPS Take 1 capsule by mouth once a week.   colchicine 0.6 MG tablet Take 0.6 mg by mouth as needed (Gout).   dapagliflozin propanediol (FARXIGA) 10 MG TABS tablet Take 10 mg by mouth daily.    Evolocumab (REPATHA SURECLICK) 140 MG/ML SOAJ Inject 140 mg into the skin every 14 (fourteen) days.   levothyroxine (SYNTHROID, LEVOTHROID) 25 MCG tablet Take 50 mcg by mouth every other day.   montelukast (SINGULAIR) 10 MG tablet Take 10 mg by mouth at bedtime.   nitroGLYCERIN (NITROSTAT) 0.4 MG SL tablet Place 1 tablet under the tongue as needed for chest pain.   nystatin cream (MYCOSTATIN) Apply 1 application  topically as needed for dry skin.   OZEMPIC, 1 MG/DOSE, 4 MG/3ML SOPN Inject 1 mg into the skin once a week.   pantoprazole (PROTONIX) 40 MG tablet Take 1 tablet (40 mg total) by mouth daily.   permethrin (ELIMITE) 5 % cream Apply 1 application topically once a week.   predniSONE (DELTASONE) 5 MG tablet Take 5 mg by mouth as needed (Arthritis).   REPATHA SURECLICK 140 MG/ML SOAJ Inject 140 mg as directed every 14 (fourteen) days.   torsemide (DEMADEX) 5 MG tablet Take 1 tablet (5 mg total) by mouth daily.   traMADol (ULTRAM) 50 MG tablet Take 1 tablet (50 mg total) by mouth every 6 (six) hours as needed. (Patient taking differently: Take 50 mg by mouth every 6 (six) hours as needed for moderate pain (pain score 4-6) or severe pain (pain score 7-10).)   TRESIBA FLEXTOUCH 100 UNIT/ML FlexTouch Pen Inject 45 Units into the skin at bedtime.   valsartan (DIOVAN) 160 MG tablet Take 160 mg by mouth daily.     Allergies:   Statins, Furosemide, Rosuvastatin, Hydrocodone-acetaminophen, Aspirin, and Vicodin [hydrocodone-acetaminophen]   Social History   Socioeconomic History   Marital status: Divorced    Spouse name: Not on file   Number of children: 7   Years of  education: Not on file   Highest education level: Not on file  Occupational History   Occupation: Retired   Tobacco Use   Smoking status: Never   Smokeless tobacco: Never   Tobacco comments:    did smoke from time to time but stopped 2018  Vaping Use   Vaping status: Never Used  Substance and Sexual Activity   Alcohol use: Yes    Comment: ocassionally   Drug use: Not Currently    Types: Marijuana    Comment: use was in the past    Sexual activity: Never  Other Topics Concern   Not on file  Social History Narrative   Not on file   Social Drivers of Health   Financial Resource Strain: Not at Risk (09/09/2022)   Received from [employer], [employer]  Financial Resource Strain: 1  Food Insecurity: Not at Risk (09/09/2022)   Received from [place], [place]   Food Insecurity    Food: 1  Transportation Needs: Not at Risk (09/09/2022)   Received from [employer] [facility]    Transportation: 1  Recent Concern: Transportation Needs - At Risk (09/09/2022)   Received from [place], [employer] [facility]eds    Transportation: 2  Physical Activity: At Risk (09/09/2022)   Received from [place], [place]   Physical Activity    Physical Activity: 2  Stress: At Risk (09/09/2022)   Received from [place], [place]   Stress    Stress: 2  Social Connections: Not at Risk (09/09/2022)   Received from [facility]   Social Connections    Connectedness: 1     Family History: The patient's family history includes Diabetes in her paternal grandmother; Diabetes type II in her father and son; Hypertension in her daughter and paternal grandmother. There is no history of Colon cancer, Esophageal cancer, or Breast cancer. ROS:   Please see the history of present illness.    All 14 point review of systems negative except as described per history of present illness  EKGs/Labs/Other Studies Reviewed:    EKG Interpretation Date/Time:  Friday July 22 2023 14:44:20 EDT Ventricular Rate:  82 PR  Interval:  170 QRS Duration:  102 QT Interval:  402 QTC Calculation: 469 R Axis:   -12  Text Interpretation: Normal sinus rhythm Low voltage QRS Nonspecific T wave abnormality Abnormal ECG When compared with ECG of 14-Jan-2021 20:29, PREVIOUS ECG IS PRESENT Confirmed by Gypsy Balsam [phone]) on 07/22/2023 2:50:27 PM    Recent Labs: 01/17/2023: BUN 22; Creatinine, Ser 0.99; Potassium 4.0; Sodium 141  Recent Lipid Panel No results found for: "CHOL", "TRIG", "HDL", "CHOLHDL", "VLDL", "LDLCALC", "LDLDIRECT"  Physical Exam:    VS:  BP (!) 142/80 (BP Location: Right Arm, Patient Position: Sitting)   Pulse 82   Ht 5\' 6"  (1.676 m)   Wt 240 lb 3.2 oz (109 kg)   SpO2 97%   BMI 38.77 kg/m     Wt Readings from Last 3 Encounters:  07/22/23 240 lb 3.2 oz (109 kg)  01/17/23 249 lb (112.9 kg)  07/07/22 248 lb (112.5 kg)     GEN:  Well nourished, well developed in no acute distress HEENT: Normal NECK: No JVD; No carotid bruits LYMPHATICS: No lymphadenopathy CARDIAC: RRR, no murmurs, no rubs, no gallops RESPIRATORY:  Clear to auscultation without rales, wheezing or rhonchi  ABDOMEN: Soft, non-tender, non-distended MUSCULOSKELETAL:  No edema; No deformity  SKIN: Warm and dry LOWER EXTREMITIES: no swelling NEUROLOGIC:  Alert and oriented x 3 PSYCHIATRIC:  Normal affect   ASSESSMENT:    1. Primary hypertension   2. Coronary artery disease involving native coronary artery of native heart without angina pectoris   3. Type 2 diabetes mellitus with diabetic polyneuropathy, with long-term current use of insulin (HCC)   4. Dyslipidemia    PLAN:    In order of problems listed above:  Coronary disease only luminal does not have any signs and symptoms that would indicate reactivation of the problem.  Will continue risk factors modifications. Dyslipidemia I did review K PN which show me LDL 36 HDL 54 continue present management including PCSK9 agent. Type 2 diabetes last hemoglobin A1c 7.3  followed by internal medicine team.  She is thinking about switching her primary care physician   Medication Adjustments/Labs and Tests Ordered: Current medicines are reviewed at  length with the patient today.  Concerns regarding medicines are outlined above.  Orders Placed This Encounter  Procedures   EKG 12-Lead   Medication changes: No orders of the defined types were placed in this encounter.   Signed, Georgeanna Lea, MD, [facility] 07/22/2023 3:05 PM    [facility]

## 2023-09-16 ENCOUNTER — Other Ambulatory Visit: Payer: Self-pay | Admitting: Gastroenterology

## 2024-02-25 ENCOUNTER — Other Ambulatory Visit: Payer: Self-pay | Admitting: Cardiology

## 2024-04-18 ENCOUNTER — Ambulatory Visit: Admitting: Podiatry

## 2024-04-18 ENCOUNTER — Encounter: Payer: Self-pay | Admitting: *Deleted

## 2024-04-18 DIAGNOSIS — M7752 Other enthesopathy of left foot: Secondary | ICD-10-CM | POA: Diagnosis not present

## 2024-04-18 DIAGNOSIS — M7662 Achilles tendinitis, left leg: Secondary | ICD-10-CM

## 2024-04-18 DIAGNOSIS — E1142 Type 2 diabetes mellitus with diabetic polyneuropathy: Secondary | ICD-10-CM

## 2024-04-18 DIAGNOSIS — M722 Plantar fascial fibromatosis: Secondary | ICD-10-CM | POA: Diagnosis not present

## 2024-04-19 ENCOUNTER — Ambulatory Visit: Attending: Cardiology | Admitting: Cardiology

## 2024-04-19 ENCOUNTER — Encounter: Payer: Self-pay | Admitting: *Deleted

## 2024-04-19 ENCOUNTER — Encounter: Payer: Self-pay | Admitting: Cardiology

## 2024-04-19 VITALS — BP 160/90 | HR 69 | Ht 66.0 in | Wt 232.0 lb

## 2024-04-19 DIAGNOSIS — E785 Hyperlipidemia, unspecified: Secondary | ICD-10-CM

## 2024-04-19 DIAGNOSIS — E119 Type 2 diabetes mellitus without complications: Secondary | ICD-10-CM

## 2024-04-19 DIAGNOSIS — I2510 Atherosclerotic heart disease of native coronary artery without angina pectoris: Secondary | ICD-10-CM

## 2024-04-19 DIAGNOSIS — I10 Essential (primary) hypertension: Secondary | ICD-10-CM

## 2024-04-19 MED ORDER — REPATHA SURECLICK 140 MG/ML ~~LOC~~ SOAJ: 140.0000 mg | SUBCUTANEOUS | 9 refills | Status: AC

## 2024-04-19 MED ORDER — NITROGLYCERIN 0.4 MG SL SUBL: 0.4000 mg | SUBLINGUAL_TABLET | SUBLINGUAL | 3 refills | Status: AC | PRN

## 2024-04-19 NOTE — Progress Notes (Unsigned)
 Cardiology Office Note:    Date:  04/19/2024   ID:  Michelle Harding, DOB 04/06/1951, MRN [id number]  PCP:  Michelle Lacinda Browning, MD  Cardiologist:  Michelle Fitch, MD    Referring MD: Michelle Lacinda Browning, MD   Chief Complaint  Patient presents with   Follow-up  Doing fine  History of Present Illness:    Michelle Harding is a 73 y.o. female past medical history significant for coronary artery disease years ago she got cardiac catheterization done showing luminal disease after the stress test being negative, multiple risk factors for coronary artery disease, dyslipidemia and diabetes does have coronary artery disease only mild.  Comes today to months for follow-up cardiac wise doing well denies having chest pain tightness squeezing pressure burning chest.  Monitor.  She has been talking to me about pain in different joints back pain the fact that she required injections over there as well as about dental issues.  Not much chronic complaints  Past Medical History:  Diagnosis Date   Abdominal pain, epigastric 05/13/2022   Aortic regurgitation 08/06/2015   Arthritis of carpometacarpal [facility]) joint of right thumb 05/26/2017   Asthma    Bradycardia by electrocardiogram 02/10/2012   Carotid artery stenosis 2024   Carpal tunnel syndrome of right wrist 08/16/2017   Chronic constipation    Chronic pain syndrome 08/06/2015   Common migraine 08/06/2015   COPD (chronic obstructive pulmonary disease) (HCC)    Coronary artery disease involving native coronary artery of native heart without angina pectoris 04/24/2015   Luminal disease by cardiac catheterization from 2013   Cubital tunnel syndrome on right 08/16/2017   Diabetes mellitus (HCC) 02/07/2012   Diabetes, polyneuropathy (HCC) 08/06/2015   Diplopia 08/06/2015   Dyslipidemia 04/24/2015   Gastroesophageal reflux disease without esophagitis 05/13/2022   Gout attack 02/07/2012   Heart disease    Hemorrhoids    Herniated nucleus  pulposus, L5-S1, right 08/06/2015   Formatting of this note might be different from the original. Right L5 and S1 roots.  L4-5, possible left L4 root   High risk medication use 08/06/2015   History of colonic polyps 05/13/2022   Hyperlipidemia 08/06/2015   Hypertension    IBS (irritable bowel syndrome)    Insulin  dependent type 2 diabetes mellitus (HCC) 08/06/2015   Knee pain 08/06/2015   Low back pain 08/06/2015   Lumbar radiculopathy 08/06/2015   Formatting of this note might be different from the original. Right   Meningioma (HCC)    Morbid obesity (HCC) 03/31/2020   Osteoarthritis    Osteoarthritis    Ovarian cyst    Preop cardiovascular exam 07/01/2020   Tear of left infraspinatus tendon 08/29/2017   Tear of left supraspinatus tendon 08/29/2017   Tenosynovitis of left wrist 08/29/2017   Trigeminal neuralgia of left side of face 07/11/2014   Type 2 diabetes mellitus with diabetic polyneuropathy, with long-term current use of insulin  (HCC) 08/06/2015   Wrist pain, acute, right 05/26/2017    Past Surgical History:  Procedure Laterality Date   CATARACT EXTRACTION Left 03/2013   CATARACT EXTRACTION Right 05/2013   CHOLECYSTECTOMY  1983   COLONOSCOPY  04/27/2012   Colonic polyp status post polypectomy. Minimal sigmoid diverticulosis. Small internal hemorrhoids.    ESOPHAGOGASTRODUODENOSCOPY  06/24/2010   Small hiatal hernia   HAND SURGERY     HERNIA REPAIR     With mesh   KNEE SURGERY  2012   OOPHORECTOMY Left 07/10/2015   OOPHORECTOMY Right 1989   ROTATOR  CUFF REPAIR     TONSILLECTOMY     TONSILLECTOMY AND ADENOIDECTOMY      Current Medications: Active Medications[1]   Allergies:   Statins, Furosemide, Rosuvastatin, Hydrocodone-acetaminophen , Aspirin, Oxcarbazepine, and Vicodin [hydrocodone-acetaminophen ]   Social History   Socioeconomic History   Marital status: Divorced    Spouse name: Not on file   Number of children: 7   Years of education: Not on file    Highest education level: Not on file  Occupational History   Occupation: Retired   Tobacco Use   Smoking status: Never   Smokeless tobacco: Never   Tobacco comments:    did smoke from time to time but stopped 2018  Vaping Use   Vaping status: Never Used  Substance and Sexual Activity   Alcohol use: Yes    Comment: ocassionally   Drug use: Not Currently    Types: Marijuana    Comment: use was in the past    Sexual activity: Never  Other Topics Concern   Not on file  Social History Narrative   Not on file   Social Drivers of Health   Tobacco Use: Low Risk (04/19/2024)   Patient History    Smoking Tobacco Use: Never    Smokeless Tobacco Use: Never    Passive Exposure: Not on file  Recent Concern: Tobacco Use - Medium Risk (03/23/2024)   Received from [facility]   Patient History    Smoking Tobacco Use: Former    Smokeless Tobacco Use: Never    Passive Exposure: Never  Physicist, Medical Strain: Not at Risk (09/09/2022)   Received from [employer]    Financial Resource Strain: 1  Food Insecurity: Low Risk (08/31/2023)   Received from [facility]   Epic    Within the past 12 months, you worried that your food would run out before you got money to buy more: Never true    Within the past 12 months, the food you bought just didn't last and you didn't have money to get more. : Never true  Transportation Needs: No Transportation Needs (08/31/2023)   Received from [facility]    In the past 12 months, has lack of reliable transportation kept you from medical appointments, meetings, work or from getting things needed for daily living? : No  Physical Activity: At Risk (09/09/2022)   Received from [facility]   Physical Activity    Physical Activity: 2  Stress: At Risk (09/09/2022)   Received from [facility]   Stress    Stress: 2  Social Connections: Not at Risk (09/09/2022)   Received from [facility]   Social Connections    Connectedness: 1   Depression (PHQ2-9): Not on file  Alcohol Screen: Not on file  Housing: Low Risk (08/31/2023)   Received from [facility]   Epic    What is your living situation today?: I have a steady place to live    Think about the place you live. Do you have problems with any of the following? Choose all that apply:: None/None on this list  Utilities: Low Risk (08/31/2023)   Received from [facility]   Utilities    In the past 12 months has the electric, gas, oil, or water company threatened to shut off services in your home? : No  Health Literacy: Not on file     Family History: The patient's family history includes Diabetes in her paternal grandmother; Diabetes type II in her father and son;  Hypertension in her daughter and paternal grandmother. There is no history of Colon cancer, Esophageal cancer, or Breast cancer. ROS:   Please see the history of present illness.    All 14 point review of systems negative except as described per history of present illness  EKGs/Labs/Other Studies Reviewed:         Recent Labs: No results found for requested labs within last 365 days.  Recent Lipid Panel No results found for: CHOL, TRIG, HDL, CHOLHDL, VLDL, LDLCALC, LDLDIRECT  Physical Exam:    VS:  BP (!) 160/90   Pulse 69   Ht 5' 6 (1.676 m)   Wt 232 lb (105.2 kg)   SpO2 97%   BMI 37.45 kg/m     Wt Readings from Last 3 Encounters:  04/19/24 232 lb (105.2 kg)  07/22/23 240 lb 3.2 oz (109 kg)  01/17/23 249 lb (112.9 kg)     GEN:  Well nourished, well developed in no acute distress HEENT: Normal NECK: No JVD; No carotid bruits LYMPHATICS: No lymphadenopathy CARDIAC: RRR, no murmurs, no rubs, no gallops RESPIRATORY:  Clear to auscultation without rales, wheezing or rhonchi  ABDOMEN: Soft, non-tender, non-distended MUSCULOSKELETAL:  No edema; No deformity  SKIN: Warm and dry LOWER EXTREMITIES: no swelling NEUROLOGIC:  Alert and oriented x 3 PSYCHIATRIC:  Normal affect    ASSESSMENT:    1. Dyslipidemia   2. Coronary artery disease involving native coronary artery of native heart without angina pectoris   3. Primary hypertension   4. Insulin  dependent type 2 diabetes mellitus (HCC)    PLAN:    In order of problems listed above:  Coronary disease, stable from that point review will continue present management. Dyslipidemia I do have last results from 2023 we will check her fasting lipid profile. Essential hypertension blood pressure always elevated in the office also she complained of having a lot of pain today I wanted to increase dose of medication she does not want to do it. Insulin -dependent diabetes mellitus that being followed by internal medicine team.  Stable hemoglobin A1c is 7.3 but that number is from 2 years ago will call primary care physician to get more up-to-date information   Medication Adjustments/Labs and Tests Ordered: Current medicines are reviewed at length with the patient today.  Concerns regarding medicines are outlined above.  Orders Placed This Encounter  Procedures   Lipid panel   Medication changes:  Meds ordered this encounter  Medications   nitroGLYCERIN (NITROSTAT) 0.4 MG SL tablet    Sig: Place 1 tablet (0.4 mg total) under the tongue as needed for chest pain.    Dispense:  25 tablet    Refill:  3   REPATHA  SURECLICK 140 MG/ML SOAJ    Sig: Inject 140 mg as directed every 14 (fourteen) days.    Dispense:  2 mL    Refill:  9    Signed, Michelle DOROTHA Fitch, MD, [facility] 04/19/2024 2:47 PM    [facility]     [1]  Current Meds  Medication Sig   allopurinol (ZYLOPRIM) 300 MG tablet Take 300 mg by mouth as needed (arthritis).   amLODipine  (NORVASC ) 10 MG tablet Take 10 mg by mouth daily.   aspirin EC 81 MG tablet Take 1 tablet by mouth daily.   carvedilol (COREG) 3.125 MG tablet Take 3.125 mg by mouth 2 (two) times daily with a meal.   Cholecalciferol (VITAMIN D3) 1.25 MG (50000 UT) CAPS  Take 1 capsule by mouth  once a week.   colchicine  0.6 MG tablet Take 0.6 mg by mouth as needed (Gout).   dapagliflozin propanediol (FARXIGA) 10 MG TABS tablet Take 10 mg by mouth daily.   Evolocumab  (REPATHA  SURECLICK) 140 MG/ML SOAJ Inject 140 mg into the skin every 14 (fourteen) days.   insulin  glargine, 1 Unit Dial, (TOUJEO SOLOSTAR) 300 UNIT/ML Solostar Pen Inject 40 Units into the skin daily.   levothyroxine  (SYNTHROID , LEVOTHROID) 25 MCG tablet Take 50 mcg by mouth every other day.   montelukast (SINGULAIR) 10 MG tablet Take 10 mg by mouth at bedtime.   nystatin cream (MYCOSTATIN) Apply 1 application  topically as needed for dry skin.   OZEMPIC, 1 MG/DOSE, 4 MG/3ML SOPN Inject 1 mg into the skin once a week.   pantoprazole  (PROTONIX ) 40 MG tablet Take 1 tablet (40 mg total) by mouth daily. Patient needs follow up appointment for future refills. Please call [phone] to schedule an appointment.   permethrin (ELIMITE) 5 % cream Apply 1 application topically once a week.   predniSONE  (DELTASONE ) 5 MG tablet Take 5 mg by mouth as needed (Arthritis).   torsemide  (DEMADEX ) 5 MG tablet TAKE 1 TABLET(5 MG) BY MOUTH DAILY   traMADol  (ULTRAM ) 50 MG tablet Take 1 tablet (50 mg total) by mouth every 6 (six) hours as needed.   valsartan (DIOVAN) 160 MG tablet Take 160 mg by mouth daily.   [DISCONTINUED] nitroGLYCERIN (NITROSTAT) 0.4 MG SL tablet Place 1 tablet under the tongue as needed for chest pain.   [DISCONTINUED] REPATHA  SURECLICK 140 MG/ML SOAJ Inject 140 mg as directed every 14 (fourteen) days.

## 2024-04-19 NOTE — Patient Instructions (Addendum)
Medication Instructions:  Your physician recommends that you continue on your current medications as directed. Please refer to the Current Medication list given to you today.  *If you need a refill on your cardiac medications before your next appointment, please call your pharmacy*   Lab Work: Lipid panel- today If you have labs (blood work) drawn today and your tests are completely normal, you will receive your results only by: MyChart Message (if you have MyChart) OR A paper copy in the mail If you have any lab test that is abnormal or we need to change your treatment, we will call you to review the results.   Testing/Procedures: None Ordered   Follow-Up: At [facility], you and your health needs are our priority.  As part of our continuing mission to provide you with exceptional heart care, we have created designated Provider Care Teams.  These Care Teams include your primary Cardiologist (physician) and Advanced Practice Providers (APPs -  Physician Assistants and Nurse Practitioners) who all work together to provide you with the care you need, when you need it.  We recommend signing up for the patient portal called "MyChart".  Sign up information is provided on this After Visit Summary.  MyChart is used to connect with patients for Virtual Visits (Telemedicine).  Patients are able to view lab/test results, encounter notes, upcoming appointments, etc.  Non-urgent messages can be sent to your provider as well.   To learn more about what you can do with MyChart, go to [email or web address].    Your next appointment:   6 month(s)  The format for your next appointment:   In Person  Provider:   Robert Krasowski, MD    Other Instructions NA  

## 2024-04-20 LAB — LIPID PANEL
Chol/HDL Ratio: 3.6 ratio (ref 0.0–4.4)
Cholesterol, Total: 185 mg/dL (ref 100–199)
HDL: 52 mg/dL (ref >39)
LDL Chol Calc (NIH): 99 mg/dL (ref 0–99)
Triglycerides: 199 mg/dL — ABNORMAL HIGH (ref 0–149)
VLDL Cholesterol Cal: 34 mg/dL (ref 5–40)

## 2024-04-22 ENCOUNTER — Encounter: Payer: Self-pay | Admitting: Podiatry

## 2024-04-22 NOTE — Progress Notes (Signed)
 Chief Complaint  Patient presents with   diabetic foot exam    Pt is here for foot Exam and is interested in Diabetic shoes. A1c 6.7 01/13/24.     Discussed the use of AI scribe software for clinical note transcription with the patient, who gave verbal consent to proceed.  History of Present Illness Michelle Harding is a 73 year old female with diabetes and arthritis who presents with foot pain and interest in the diabetic shoe program. She will be referred to [facility] for diabetic shoes due to the absence of our in-house pedorthist.  She is seeking assistance with the diabetic shoe program, having previously received diabetic shoes that were similar to regular tennis shoes. She has not been able to follow up with her previous foot doctor who recommended a specific type of shoe due to losing contact. She experiences swelling under the pads of her feet, which worsens with increased walking. She reports that her previous foot doctor told her that x-rays showed fluid in the area.  She has a knot on the back of her left heel that has been present for approximately two and a half weeks. Initially larger and more protruding, the knot remains sore and has not decreased in size. No specific injury preceded the onset of the knot.  She attributes some of her symptoms to arthritis, which she states is 'damaging my body quite badly.' Her medical history includes arthritis, specifically gout, rheumatoid arthritis, and inflammatory arthritis. She has not experienced a recent gout flare-up, which she describes as causing severe sickness when it occurs. She is familiar with prednisone  but is cautious about its use due to its impact on blood sugar levels.  She has multiple medications but prefers to avoid taking them, expressing a preference for natural remedies. She has experienced areas of decreased sensation in her body, and during a monofilament test, she felt sensations in various parts of her feet, though some  areas were less sensitive than others.   Past Medical History:  Diagnosis Date   Abdominal pain, epigastric 05/13/2022   Aortic regurgitation 08/06/2015   Arthritis of carpometacarpal [facility]) joint of right thumb 05/26/2017   Asthma    Bradycardia by electrocardiogram 02/10/2012   Carotid artery stenosis 2024   Carpal tunnel syndrome of right wrist 08/16/2017   Chronic constipation    Chronic pain syndrome 08/06/2015   Common migraine 08/06/2015   COPD (chronic obstructive pulmonary disease) (HCC)    Coronary artery disease involving native coronary artery of native heart without angina pectoris 04/24/2015   Luminal disease by cardiac catheterization from 2013   Cubital tunnel syndrome on right 08/16/2017   Diabetes mellitus (HCC) 02/07/2012   Diabetes, polyneuropathy (HCC) 08/06/2015   Diplopia 08/06/2015   Dyslipidemia 04/24/2015   Gastroesophageal reflux disease without esophagitis 05/13/2022   Gout attack 02/07/2012   Heart disease    Hemorrhoids    Herniated nucleus pulposus, L5-S1, right 08/06/2015   Formatting of this note might be different from the original. Right L5 and S1 roots.  L4-5, possible left L4 root   High risk medication use 08/06/2015   History of colonic polyps 05/13/2022   Hyperlipidemia 08/06/2015   Hypertension    IBS (irritable bowel syndrome)    Insulin  dependent type 2 diabetes mellitus (HCC) 08/06/2015   Knee pain 08/06/2015   Low back pain 08/06/2015   Lumbar radiculopathy 08/06/2015   Formatting of this note might be different from the original. Right   Meningioma (HCC)  Morbid obesity (HCC) 03/31/2020   Osteoarthritis    Osteoarthritis    Ovarian cyst    Preop cardiovascular exam 07/01/2020   Tear of left infraspinatus tendon 08/29/2017   Tear of left supraspinatus tendon 08/29/2017   Tenosynovitis of left wrist 08/29/2017   Trigeminal neuralgia of left side of face 07/11/2014   Type 2 diabetes mellitus with diabetic polyneuropathy,  with long-term current use of insulin  (HCC) 08/06/2015   Wrist pain, acute, right 05/26/2017   Past Surgical History:  Procedure Laterality Date   CATARACT EXTRACTION Left 03/2013   CATARACT EXTRACTION Right 05/2013   CHOLECYSTECTOMY  1983   COLONOSCOPY  04/27/2012   Colonic polyp status post polypectomy. Minimal sigmoid diverticulosis. Small internal hemorrhoids.    ESOPHAGOGASTRODUODENOSCOPY  06/24/2010   Small hiatal hernia   HAND SURGERY     HERNIA REPAIR     With mesh   KNEE SURGERY  2012   OOPHORECTOMY Left 07/10/2015   OOPHORECTOMY Right 1989   ROTATOR CUFF REPAIR     TONSILLECTOMY     TONSILLECTOMY AND ADENOIDECTOMY     Allergies[1]  Physical Exam EXTREMITIES: Right heel inflamed on palpation with pain on palpation plantar and posterior left heel.  No gaps or nodules in achilles tendon.  Palpable pedal pulses bilateral. NEUROLOGICAL: Decreased sensation to vibration at first MPJ bilaterally. Monofilament test: Minimal decreased sensation with vibration to the first metatarsophalangeal joint (MPJ) bilaterally   Assessment/Plan of Care: 1. Achilles tendinitis, left leg   2. Bursitis of left foot   3. Type 2 diabetes mellitus with peripheral neuropathy (HCC)   4. Plantar fasciitis, left     FOR HOME USE ONLY DME DIABETIC SHOE FOR HOME USE ONLY DME NIGHT SPLINT Assessment & Plan Left Achilles tendinitis with bursitis Left Achilles tendinitis with associated bursitis, present for approximately two and a half weeks. The area is warm and inflamed, suggesting bursitis. No recent injury reported. She has arthritis, which may contribute to the condition. She prefers non-pharmacological interventions due to concerns about medication burden and potential blood sugar elevation from prednisone . - Fitted for a night splint to maintain the foot in a neutral position during sleep. - Provided stretching exercises, though she may aggravate inflammation. - Discussed physical therapy as  an option, noting the time commitment required.  Type 2 diabetes mellitus with early peripheral neuropathy Interest in the diabetic shoe program. Previous diabetic shoes were not suitable. She experiences foot swelling and has been advised to use compression socks. She is interested in obtaining new diabetic shoes through the program. - Referred to [facility] for diabetic shoe fitting and consultation. - Completed and provided necessary paperwork for the diabetic shoe program. - Instructed to have her family doctor complete the remaining paperwork required for the program.  F/u annually   Montague Corella DSABRA [place], DPM, FACFAS [facility]     [address] [place], [place] [address]                Office [phone]  Fax [phone]     [1]  Allergies Allergen Reactions   Statins Swelling   Furosemide Swelling   Rosuvastatin Swelling and Other (See Comments)   Hydrocodone-Acetaminophen  Other (See Comments)   Aspirin     Patient  says it hurts her stomach, has to be coaed   Oxcarbazepine Dermatitis    Other Reaction(s): Unknown  drowsiness   Vicodin [Hydrocodone-Acetaminophen ]     Patient says Vicodin makes her itch

## 2024-04-22 NOTE — Patient Instructions (Signed)

## 2024-04-23 ENCOUNTER — Ambulatory Visit: Payer: Self-pay | Admitting: Cardiology

## 2025-04-18 ENCOUNTER — Ambulatory Visit: Admitting: Podiatry
